# Patient Record
Sex: Female | Born: 1955 | Race: Black or African American | Hispanic: No | Marital: Single | Smoking: Former smoker
Health system: Southern US, Community
[De-identification: ages and names within clinical notes are randomized; demographics above are authoritative.]

## PROBLEM LIST (undated history)

## (undated) DIAGNOSIS — I10 Essential (primary) hypertension: Secondary | ICD-10-CM

## (undated) DIAGNOSIS — D649 Anemia, unspecified: Secondary | ICD-10-CM

## (undated) HISTORY — DX: Anemia, unspecified: D64.9

## (undated) HISTORY — DX: Essential (primary) hypertension: I10

## (undated) HISTORY — PX: TUBAL LIGATION: SHX77

## (undated) HISTORY — PX: KNEE SURGERY: SHX244

## (undated) HISTORY — PX: ABDOMINAL HYSTERECTOMY: SHX81

---

## 1998-08-11 ENCOUNTER — Other Ambulatory Visit: Admission: RE | Admit: 1998-08-11 | Discharge: 1998-08-11 | Payer: Self-pay | Admitting: Internal Medicine

## 2001-11-15 ENCOUNTER — Other Ambulatory Visit: Admission: RE | Admit: 2001-11-15 | Discharge: 2001-11-15 | Payer: Self-pay | Admitting: Obstetrics & Gynecology

## 2001-11-21 ENCOUNTER — Encounter: Payer: Self-pay | Admitting: Obstetrics & Gynecology

## 2001-11-21 ENCOUNTER — Ambulatory Visit (HOSPITAL_COMMUNITY): Admission: RE | Admit: 2001-11-21 | Discharge: 2001-11-21 | Payer: Self-pay | Admitting: Obstetrics & Gynecology

## 2002-05-11 ENCOUNTER — Encounter (INDEPENDENT_AMBULATORY_CARE_PROVIDER_SITE_OTHER): Payer: Self-pay | Admitting: *Deleted

## 2002-05-12 ENCOUNTER — Inpatient Hospital Stay (HOSPITAL_COMMUNITY): Admission: AD | Admit: 2002-05-12 | Discharge: 2002-05-13 | Payer: Self-pay | Admitting: Obstetrics & Gynecology

## 2003-10-29 ENCOUNTER — Ambulatory Visit (HOSPITAL_COMMUNITY): Admission: RE | Admit: 2003-10-29 | Discharge: 2003-10-29 | Payer: Self-pay | Admitting: Obstetrics & Gynecology

## 2003-11-01 ENCOUNTER — Encounter: Admission: RE | Admit: 2003-11-01 | Discharge: 2003-11-01 | Payer: Self-pay | Admitting: Obstetrics & Gynecology

## 2004-10-08 ENCOUNTER — Ambulatory Visit (HOSPITAL_COMMUNITY): Admission: RE | Admit: 2004-10-08 | Discharge: 2004-10-08 | Payer: Self-pay | Admitting: Gastroenterology

## 2004-10-08 ENCOUNTER — Encounter (INDEPENDENT_AMBULATORY_CARE_PROVIDER_SITE_OTHER): Payer: Self-pay | Admitting: *Deleted

## 2004-10-20 ENCOUNTER — Ambulatory Visit (HOSPITAL_COMMUNITY): Admission: RE | Admit: 2004-10-20 | Discharge: 2004-10-20 | Payer: Self-pay | Admitting: Gastroenterology

## 2006-02-18 ENCOUNTER — Ambulatory Visit (HOSPITAL_COMMUNITY): Admission: RE | Admit: 2006-02-18 | Discharge: 2006-02-18 | Payer: Self-pay | Admitting: Obstetrics & Gynecology

## 2007-04-06 ENCOUNTER — Ambulatory Visit (HOSPITAL_COMMUNITY): Admission: RE | Admit: 2007-04-06 | Discharge: 2007-04-06 | Payer: Self-pay | Admitting: Obstetrics & Gynecology

## 2010-01-15 ENCOUNTER — Ambulatory Visit (HOSPITAL_COMMUNITY): Admission: RE | Admit: 2010-01-15 | Discharge: 2010-01-15 | Payer: Self-pay | Admitting: Obstetrics & Gynecology

## 2010-01-20 ENCOUNTER — Ambulatory Visit: Payer: Self-pay | Admitting: Genetic Counselor

## 2010-02-16 ENCOUNTER — Emergency Department (HOSPITAL_COMMUNITY): Admission: EM | Admit: 2010-02-16 | Discharge: 2010-02-16 | Payer: Self-pay | Admitting: Emergency Medicine

## 2010-05-16 ENCOUNTER — Encounter: Payer: Self-pay | Admitting: Cardiology

## 2010-05-17 ENCOUNTER — Encounter: Payer: Self-pay | Admitting: Obstetrics & Gynecology

## 2010-05-18 ENCOUNTER — Encounter: Payer: Self-pay | Admitting: Obstetrics & Gynecology

## 2010-07-08 LAB — DIFFERENTIAL
Lymphocytes Relative: 53 % — ABNORMAL HIGH (ref 12–46)
Lymphs Abs: 3.4 10*3/uL (ref 0.7–4.0)
Monocytes Absolute: 0.4 10*3/uL (ref 0.1–1.0)
Monocytes Relative: 6 % (ref 3–12)
Neutrophils Relative %: 39 % — ABNORMAL LOW (ref 43–77)

## 2010-07-08 LAB — POCT CARDIAC MARKERS
CKMB, poc: 2.5 ng/mL (ref 1.0–8.0)
Myoglobin, poc: 69.5 ng/mL (ref 12–200)
Troponin i, poc: <0.05 ng/mL (ref 0.00–0.09)

## 2010-07-08 LAB — POCT I-STAT, CHEM 8
Calcium, Ion: 1.25 mmol/L (ref 1.12–1.32)
Chloride: 105 mEq/L (ref 96–112)
Creatinine, Ser: 0.7 mg/dL (ref 0.4–1.2)
Potassium: 3.8 mEq/L (ref 3.5–5.1)
Sodium: 139 mEq/L (ref 135–145)
TCO2: 26 mmol/L (ref 0–100)

## 2010-07-08 LAB — CBC
HCT: 35.3 % — ABNORMAL LOW (ref 36.0–46.0)
MCH: 21.3 pg — ABNORMAL LOW (ref 26.0–34.0)
Platelets: 300 10*3/uL (ref 150–400)

## 2010-09-11 NOTE — H&P (Signed)
   [name], [name]                           ACCOUNT NO.:  [id number]   MEDICAL RECORD NO.:  [id number]                   PATIENT TYPE:  INP   LOCATION:  9306                                 FACILITY:  [facility]   PHYSICIAN:  Roseanna Rainbow, M.D.         DATE OF BIRTH:  1955/12/30   DATE OF ADMISSION:  05/12/2002  DATE OF DISCHARGE:  05/13/2002                                HISTORY & PHYSICAL   REDICTATION:   CHIEF COMPLAINT:  The patient is a 55 year old African-American female with  symptomatic uterine fibroids who presents for a total vaginal hysterectomy.   HISTORY OF PRESENT ILLNESS:  As above.   PAST OB/GYN HISTORY:  She is status post one NSVD and a bilateral tubal  ligation.   PAST MEDICAL HISTORY:  She denies.   ALLERGIES:  No known drug allergies.   MEDICATIONS:  She denies.   SOCIAL HISTORY:  She denies any tobacco, ethanol, or substance abuse.   PHYSICAL EXAMINATION:  VITAL SIGNS:  Blood pressure 130/76.  GENERAL:  Well-developed, well-nourished.  LUNGS:  Clear to auscultation bilaterally.  HEART:  Regular rate and rhythm.  ABDOMEN:  Soft, nontender.  No organomegaly.  PELVIC:  The external female genitalia are normal appearing.  On speculum  examination the vagina is clean.  On bimanual examination the uterus is  retroverted, approximately 12 weeks size in aggregate.  EXTREMITIES:  Nontender.   ASSESSMENT:  Moderately enlarged leiomyomatous uterus, symptomatic.   PLAN:  Total vaginal hysterectomy.                                               Roseanna Rainbow, M.D.    Judee Clara  D:  06/07/2002  T:  06/07/2002  Job:  [id number]

## 2010-09-11 NOTE — Op Note (Signed)
[name], [name]                           ACCOUNT NO.:  [id number]   MEDICAL RECORD NO.:  [id number]                   PATIENT TYPE:  OBV   LOCATION:  9399                                 FACILITY:  [facility]   PHYSICIAN:  Roseanna Rainbow, M.D.         DATE OF BIRTH:  March 20, 1956   DATE OF PROCEDURE:  05/11/2002  DATE OF DISCHARGE:                                 OPERATIVE REPORT   PREOPERATIVE DIAGNOSES:  Uterine fibroids.   POSTOPERATIVE DIAGNOSES:  Uterine fibroids.   PROCEDURE:  1. Total vaginal hysterectomy.  2. Morcellation with myomectomies and coring.  3. Modified McCall culdoplasty was performed as well.   SURGEON:  Roseanna Rainbow, M.D., Bing Neighbors. Clearance Coots, M.D.   ANESTHESIA:  General endotracheal.   COMPLICATIONS:  None.   ESTIMATED BLOOD LOSS:  300 cubic centimeters.   URINE OUTPUT:  200 cubic centimeters clear urine at the end of the  procedure.   FLUIDS:  As per anesthesiology.   FINDINGS:  Examination under anesthesia:  Retroverted uterus, upward normal  limits in size.  Normal tubes and ovaries.   PROCEDURE:  The risks, benefits, indications, and alternatives of the  procedure were reviewed with the patient and informed consent was obtained.  The patient was taken to the operating room with an IV running.  The patient  was placed in the dorsal lithotomy position and prepped and draped in the  usual sterile fashion.  A weighted speculum was then placed into the vagina  and the cervix grasped with the Christella Hartigan' tenaculum.  The cervix was then  injected circumferentially with 1% lidocaine with 1:200,000 of epinephrine.  The cervix was then incised anteriorly with the scalpel and the bladder  dissected off the pubovesical cervical fascia anteriorly with Metzenbaum  scissors.  The anterior cul-de-sac was entered sharply.  The same procedure  was performed posteriorly and the posterior cul-de-sac entered sharply  without difficulty.  At this point  parametrial clamps were placed over the  uterosacral ligaments on either side.  These were then transected and suture  ligated with 0 Vicryl.  Hemostasis was assured.  The cardinal ligaments were  then clamped on both sides, transected, and suture ligated in a similar  fashion.  The uterine arteries and broad ligament were then serially clamped  with parametrial clamps, transected, and suture ligated on both sides.  At  this point the uterine fundus was then debulked using coring and also  excising the myomas.  The uterine fundus was then bivalved.  The cornu were  then clamped with parametrial clamps, transected.  The pedicles were secured  with both free ligatures and suture ligatures with excellent hemostasis  noted.  At this point a Rogelio Seen culdoplasty was performed.  Suture was passed  from the vagina into the middle aspect of the posterior cul-de-sac and the  suture was then used to incorporate the uterosacral pedicle and reef across  the peritoneum  of the posterior cul-de-sac and then passed through the  opposite uterosacral pedicle and then passed through the posterior cul-de-  sac into the vagina and the suture was then tied.  The vaginal cuff angles  were closed with figure-of-eight sutures of 0 Vicryl on both sides.  The  uterosacral ligaments were plicated in the midline.  The remainder of the  vaginal cuff was closed with figure-of-eight sutures of 0 Vicryl in an  interrupted fashion.  All instruments were then removed from the vagina and  the patient taken out of dorsal lithotomy position and awakened from general  anesthesia.  The patient was taken to the PACU in stable condition.  Sponge,  lap, needle, and instrument counts were correct x2.                                               Roseanna Rainbow, M.D.    [name]  D:  05/11/2002  T:  05/11/2002  Job:  [id number]

## 2011-05-27 ENCOUNTER — Ambulatory Visit: Payer: BC Managed Care – PPO

## 2011-05-27 ENCOUNTER — Ambulatory Visit (INDEPENDENT_AMBULATORY_CARE_PROVIDER_SITE_OTHER): Payer: BC Managed Care – PPO | Admitting: Family Medicine

## 2011-05-27 DIAGNOSIS — R0989 Other specified symptoms and signs involving the circulatory and respiratory systems: Secondary | ICD-10-CM

## 2011-05-27 DIAGNOSIS — K635 Polyp of colon: Secondary | ICD-10-CM | POA: Insufficient documentation

## 2011-05-27 DIAGNOSIS — M25473 Effusion, unspecified ankle: Secondary | ICD-10-CM

## 2011-05-27 DIAGNOSIS — R0600 Dyspnea, unspecified: Secondary | ICD-10-CM

## 2011-05-27 DIAGNOSIS — D649 Anemia, unspecified: Secondary | ICD-10-CM

## 2011-05-27 DIAGNOSIS — I10 Essential (primary) hypertension: Secondary | ICD-10-CM | POA: Insufficient documentation

## 2011-05-27 DIAGNOSIS — H6120 Impacted cerumen, unspecified ear: Secondary | ICD-10-CM

## 2011-05-27 LAB — POCT CBC
HCT, POC: 31.8 % — AB (ref 37.7–47.9)
Hemoglobin: 9.8 g/dL — AB (ref 12.2–16.2)
MCH, POC: 19.8 pg — AB (ref 27–31.2)
POC LYMPH PERCENT: 47.6 %L (ref 10–50)
Platelet Count, POC: 326 10*3/uL (ref 142–424)
RBC: 4.94 M/uL (ref 4.04–5.48)
RDW, POC: 16.5 %

## 2011-05-27 MED ORDER — ALBUTEROL SULFATE HFA 108 (90 BASE) MCG/ACT IN AERS: 2.0000 | INHALATION_SPRAY | Freq: Four times a day (QID) | RESPIRATORY_TRACT | Status: DC | PRN

## 2011-05-27 NOTE — Patient Instructions (Addendum)
Restart iron twice per day.  claritin over the counter once per day. If wheezing, can try albuterol inhaler. Restart blood pressure med, and check blood pressures outside of office. We will have the radiologist look at your ankle xray, and gout test has been ordered. Recheck in 1 week, sooner if any worsening.  If symptoms not improving may need to follow up with gastroenterologist, and cardiology evaluation.  If any worsening of symptoms, or chest pain, call 911/go to ER.

## 2011-05-27 NOTE — Assessment & Plan Note (Addendum)
Prior hgb 11.4 - now 9.8.  Likely cause of DOE.  Restart iron bid, and follow up in next 1 week, sooner if worsening dyspnea. May need to see GI (las colonoscopy 2011)- will discuss at follow up in 1 week.

## 2011-05-27 NOTE — Assessment & Plan Note (Signed)
Off meds currently.  Will restart today.  Follow up next 3-4 weeks with outside bps.

## 2011-05-27 NOTE — Progress Notes (Signed)
Subjective:    Patient ID: [name], female    DOB: 1955/08/20, 56 y.o.   MRN: [id number]  Ear Fullness  There is pain in the right ear. This is a chronic problem. The current episode started more than 1 month ago. Associated symptoms include coughing and hearing loss. Pertinent negatives include no ear discharge, headaches, rash, rhinorrhea, sore throat or vomiting. She has tried ear drops for the symptoms. The treatment provided mild relief. There is no history of a chronic ear infection, hearing loss or a tympanostomy tube.  Shortness of Breath The current episode started yesterday. The problem occurs intermittently. The problem has been waxing and waning. Associated symptoms include coryza, ear pain, orthopnea and wheezing. Pertinent negatives include no chest pain, fever, headaches, hemoptysis, leg swelling, PND, rash, rhinorrhea, sore throat, sputum production, swollen glands or vomiting. The symptoms are aggravated by any activity and weather changes. Risk factors include oral contraceptive. She has tried nothing for the symptoms. The treatment provided mild relief. Her past medical history is significant for asthma. There is no history of CAD, COPD, DVT, a heart failure, PE, pneumonia or a recent surgery.   Ear fullness  for 2 months.  Better x 1 day with use of otc swimmers ear.  Occasional pain, but more of a stopped up feeling. No discharge.  Qtips to outside only.  SOB 2 days ago.with cold weather.  No fever, with activity - cough with inhaling air.  Hx asthma in past, but no recent flairs.  No rescue inhaler. Dyspnea with exertion. 3 pillows to sleep - chronic.  Sob if less than 3. Dry cough wheeze yesterday. Improves with rest. Indigestion with belching for 2 weeks.  Works in school, Education officer, environmental.  Dust causes more sx's past few days.  No hx cards eval.  Hx HTN -  Off BP meds past 5 days, going to pick up at pharmacy today.  Fasting today.  Right ankle swelling past 6 months.  NKI.  Worse after standing for long time.  No calf pain.  Tx: ace bandage.  No prior xr, or evaluation.  Review of Systems  Constitutional: Positive for fatigue. Negative for fever and chills.  HENT: Positive for hearing loss, ear pain and sneezing. Negative for congestion, sore throat, rhinorrhea, postnasal drip and ear discharge.   Eyes: Positive for pain.       Burning at times, crusting in am at times.  Dust causes worse sx's  Respiratory: Positive for cough, shortness of breath and wheezing. Negative for hemoptysis, sputum production and chest tightness.   Cardiovascular: Positive for palpitations and orthopnea. Negative for chest pain, leg swelling and PND.  Gastrointestinal: Negative for vomiting.  Musculoskeletal: Positive for joint swelling and arthralgias.       Right ankle only not calf.  Skin: Positive for wound. Negative for color change and rash.  Neurological: Negative for dizziness, light-headedness and headaches.  Hematological: Does not bruise/bleed easily.       Objective:   Physical Exam  Constitutional: She is oriented to person, place, and time. She appears well-developed and well-nourished.  HENT:  Head: Normocephalic and atraumatic.  Right Ear: External ear normal. No drainage, swelling or tenderness. No foreign bodies. No mastoid tenderness.  Left Ear: External ear normal. No drainage, swelling or tenderness. No foreign bodies. No mastoid tenderness.  Nose: Nose normal.  Mouth/Throat: Oropharynx is clear and moist and mucous membranes are normal.       Cerumen bilterally, obstructive on rt - unable to  visualize tms  Eyes: Conjunctivae and EOM are normal. Pupils are equal, round, and reactive to light.  Neck: Normal range of motion. Neck supple.  Cardiovascular: Normal rate, regular rhythm, normal heart sounds and normal pulses.   No extrasystoles are present.  No murmur heard.      No bruits.  Pulmonary/Chest: Effort normal and breath sounds normal. No respiratory  distress. She has no wheezes. She has no rales. She exhibits no tenderness.  Abdominal: Soft.  Musculoskeletal: Normal range of motion.       Right ankle: She exhibits swelling. She exhibits normal range of motion, no ecchymosis, no deformity, no laceration and normal pulse. No lateral malleolus and no medial malleolus tenderness found.  Neurological: She is alert and oriented to person, place, and time.  Skin: Skin is warm and dry.  Psychiatric: She has a normal mood and affect.   Results for orders placed in visit on 05/27/11  POCT CBC      Component Value Range   WBC 6.7  4.6 - 10.2 (K/uL)   Lymph, poc 3.2  0.6 - 3.4    POC LYMPH PERCENT 47.6  10 - 50 (%L)   MID (cbc) 0.3  0 - 0.9    POC MID % 5.0  0 - 12 (%M)   POC Granulocyte 3.2  2 - 6.9    Granulocyte percent 47.4  37 - 80 (%G)   RBC 4.94  4.04 - 5.48 (M/uL)   Hemoglobin 9.8 (*) 12.2 - 16.2 (g/dL)   HCT, POC 40.9 (*) 81.1 - 47.9 (%)   MCV 64.3 (*) 80 - 97 (fL)   MCH, POC 19.8 (*) 27 - 31.2 (pg)   MCHC 30.8 (*) 31.8 - 35.4 (g/dL)   RDW, POC 91.4     Platelet Count, POC 326  142 - 424 (K/uL)   MPV 10.3  0 - 99.8 (fL)     EKG: sr, no acute findings - see scanned copy [facility] reading (PRIMARY) by  Dr. Neva Seat: Ankle: calcification medial malleolus CXR: NAD   Assessment & Plan:   1. Anemia  POCT CBC, IFOBT POC (occult bld, rslt in office)  2. HTN (hypertension)  Comprehensive metabolic panel, Lipid panel  3. Dyspnea  EKG 12-Lead, DG Chest 2 View, POCT CBC, Lipid panel  4. Cerumen impaction    5. Ankle swelling  Uric acid, DG Ankle Complete Right   -see problem list re: anemia, htn.  Recent dyspnea, cough with episodic wheeze, hx asthma.  - likely allergic.  otc claritin qd, proair q4-6h wheeze, recheck if not improved in 2-3d, o/w 1 week.  If any chest pain, or worse sx's - call 911/go to ER.  Improved hearing post r lavage.  Debrox otc in future, and rtc if not improved. Avoid qtips.  Ankle swelling - OA vs other  arthropathy  -xr to overread, check uric acid.

## 2011-05-28 LAB — COMPREHENSIVE METABOLIC PANEL
ALT: 9 U/L (ref 0–35)
AST: 10 U/L (ref 0–37)
BUN: 11 mg/dL (ref 6–23)
Chloride: 107 mEq/L (ref 96–112)
Glucose, Bld: 82 mg/dL (ref 70–99)
Total Bilirubin: 0.4 mg/dL (ref 0.3–1.2)
Total Protein: 6.6 g/dL (ref 6.0–8.3)

## 2011-05-28 LAB — LIPID PANEL
Cholesterol: 170 mg/dL (ref 0–200)
Total CHOL/HDL Ratio: 2.4 Ratio
VLDL: 12 mg/dL (ref 0–40)

## 2011-06-15 ENCOUNTER — Ambulatory Visit (INDEPENDENT_AMBULATORY_CARE_PROVIDER_SITE_OTHER): Payer: BC Managed Care – PPO | Admitting: Family Medicine

## 2011-06-15 VITALS — BP 150/102 | HR 80 | Temp 98.2°F | Resp 18 | Ht 68.5 in | Wt 240.4 lb

## 2011-06-15 DIAGNOSIS — D649 Anemia, unspecified: Secondary | ICD-10-CM

## 2011-06-15 DIAGNOSIS — I10 Essential (primary) hypertension: Secondary | ICD-10-CM

## 2011-06-15 DIAGNOSIS — R509 Fever, unspecified: Secondary | ICD-10-CM

## 2011-06-15 DIAGNOSIS — J019 Acute sinusitis, unspecified: Secondary | ICD-10-CM

## 2011-06-15 LAB — POCT CBC
MCV: 63.1 fL — AB (ref 80–97)
MID (cbc): 0.4 (ref 0–0.9)
POC LYMPH PERCENT: 43 %L (ref 10–50)
Platelet Count, POC: 323 10*3/uL (ref 142–424)
RBC: 5.42 M/uL (ref 4.04–5.48)
WBC: 7 10*3/uL (ref 4.6–10.2)

## 2011-06-15 MED ORDER — CEFDINIR 300 MG PO CAPS: 300.0000 mg | ORAL_CAPSULE | Freq: Two times a day (BID) | ORAL | Status: AC

## 2011-06-15 MED ORDER — HYDROCHLOROTHIAZIDE 25 MG PO TABS: 25.0000 mg | ORAL_TABLET | Freq: Every day | ORAL | Status: DC

## 2011-06-15 NOTE — Progress Notes (Signed)
Subjective:    Patient ID: [name], female    DOB: Oct 27, 1955, 56 y.o.   MRN: [id number]  HPI [name] is a 56 y.o. female See last ov - multiple issues addressed including HTN, Dyspnea, and anemia. Instructed to restart iron, rx proair, and restarted HCTZ.  Has not checked outside blood pressures. No missed doses.  No chest pain.   Breathing ok, did not need to fill inhaler, but face and nose more congested since last office visit. Thick yellow nasal discharge with blood past 3 nights.  Feels sinuses are worse.  Burning in face.  subjective hot in face - has not checked temp.  Stopped claritin after 5 days as it did not help.  Tx: ibuprofen, no nasal sprays.   Review of Systems  Constitutional: Positive for fever and chills.       Subjective  HENT: Positive for congestion and sinus pressure. Negative for nosebleeds.        Blood in mucus only, no true nosebleed.  Respiratory: Positive for cough. Negative for shortness of breath.   Cardiovascular: Negative for chest pain.  Gastrointestinal: Negative for abdominal pain and blood in stool.  Neurological: Positive for light-headedness and headaches.       Objective:   Physical Exam  Constitutional: She is oriented to person, place, and time. She appears well-developed and well-nourished. No distress.  HENT:  Head: Normocephalic and atraumatic.  Right Ear: Hearing, tympanic membrane, external ear and ear canal normal.  Left Ear: Hearing, tympanic membrane, external ear and ear canal normal.  Nose: Mucosal edema present. No nose lacerations or nasal septal hematoma. No epistaxis. Right sinus exhibits no maxillary sinus tenderness and no frontal sinus tenderness. Left sinus exhibits no maxillary sinus tenderness and no frontal sinus tenderness.  Mouth/Throat: Oropharynx is clear and moist. No oropharyngeal exudate.  Eyes: Conjunctivae and EOM are normal. Pupils are equal, round, and reactive to light.  Cardiovascular: Normal  rate, regular rhythm, normal heart sounds and intact distal pulses.   No murmur heard. Pulmonary/Chest: Effort normal and breath sounds normal. No respiratory distress. She has no wheezes. She has no rhonchi.  Abdominal: Soft. There is no tenderness.  Neurological: She is alert and oriented to person, place, and time.  Skin: Skin is warm and dry. No rash noted.  Psychiatric: She has a normal mood and affect. Her behavior is normal.    BMP reviewed from 05/27/11.  Creatinine in normal range.  Results for orders placed in visit on 06/15/11  POCT CBC      Component Value Range   WBC 7.0  4.6 - 10.2 (K/uL)   Lymph, poc 3.0  0.6 - 3.4    POC LYMPH PERCENT 43.0  10 - 50 (%L)   MID (cbc) 0.4  0 - 0.9    POC MID % 5.2  0 - 12 (%M)   POC Granulocyte 3.6  2 - 6.9    Granulocyte percent 51.8  37 - 80 (%G)   RBC 5.42  4.04 - 5.48 (M/uL)   Hemoglobin 10.8 (*) 12.2 - 16.2 (g/dL)   HCT, POC 52.8 (*) 41.3 - 47.9 (%)   MCV 63.1 (*) 80 - 97 (fL)   MCH, POC 19.9 (*) 27 - 31.2 (pg)   MCHC 31.6 (*) 31.8 - 35.4 (g/dL)   RDW, POC 24.4     Platelet Count, POC 323  142 - 424 (K/uL)   MPV    0 - 99.8 (fL)  Assessment & Plan:   1. Anemia    2. HTN (hypertension)    3. Sinusitis acute  POCT CBC  4. Fever  POCT CBC   Hemoglobin is improving, can increase iron to BID.  Start saline ns and omnicef for sinusitis.  Ok to restart claritin as allergic contributor possible.increase hctz to 25mg  qd, and check home BP's.  Orthostatic precautions reviewed.  If any increased lightheadedness or dizziness - return to clinic. Recheck OV in 2-3 weeks,  Return to the clinic or go to the nearest emergency room if  symptoms worsen or new symptoms occur.

## 2011-06-15 NOTE — Patient Instructions (Signed)

## 2011-10-25 ENCOUNTER — Other Ambulatory Visit: Payer: Self-pay | Admitting: Family Medicine

## 2012-01-21 ENCOUNTER — Other Ambulatory Visit: Payer: Self-pay | Admitting: Physician Assistant

## 2012-02-24 ENCOUNTER — Other Ambulatory Visit: Payer: Self-pay | Admitting: Physician Assistant

## 2012-05-11 ENCOUNTER — Ambulatory Visit (INDEPENDENT_AMBULATORY_CARE_PROVIDER_SITE_OTHER): Payer: BC Managed Care – PPO | Admitting: Family Medicine

## 2012-05-11 VITALS — BP 146/95 | HR 71 | Temp 98.1°F | Resp 18 | Ht 68.5 in | Wt 223.8 lb

## 2012-05-11 DIAGNOSIS — I10 Essential (primary) hypertension: Secondary | ICD-10-CM

## 2012-05-11 DIAGNOSIS — J329 Chronic sinusitis, unspecified: Secondary | ICD-10-CM

## 2012-05-11 DIAGNOSIS — H6120 Impacted cerumen, unspecified ear: Secondary | ICD-10-CM

## 2012-05-11 DIAGNOSIS — D649 Anemia, unspecified: Secondary | ICD-10-CM

## 2012-05-11 LAB — POCT CBC
HCT, POC: 35.8 % — AB (ref 37.7–47.9)
Hemoglobin: 11.2 g/dL — AB (ref 12.2–16.2)
Lymph, poc: 4.4 — AB (ref 0.6–3.4)
MCV: 65.2 fL — AB (ref 80–97)
MID (cbc): 0.4 (ref 0–0.9)
POC Granulocyte: 2.4 (ref 2–6.9)
POC LYMPH PERCENT: 60.7 %L — AB (ref 10–50)
Platelet Count, POC: 3183 10*3/uL — AB (ref 142–424)
RDW, POC: 17.4 %

## 2012-05-11 MED ORDER — HYDROCHLOROTHIAZIDE 25 MG PO TABS: 25.0000 mg | ORAL_TABLET | Freq: Every day | ORAL | Status: DC

## 2012-05-11 NOTE — Progress Notes (Signed)
Subjective:    Patient ID: [name], female    DOB: 05-31-1955, 57 y.o.   MRN: [id number] Chief Complaint  Patient presents with  . Medication Refill    patient needs a refill on hztz    HPI  HA sore throat, left sinus pressure, no appetite.  Has hot flashes, had to go home from work.  Sluggish feeling.  Starts whenever the weather chagnes, eyes burns, arms and fingers hurting at night but chronic. Ate some juice and a organic candy bar today so not completely fasting. Gets swelling in ankle. Has been doing an herbal clense and garcinia cambogia which is supposed to be an appetite suppressant. Has been out of her hctz for several days No flu shot this year  Review of Systems  Constitutional: Positive for diaphoresis, activity change, appetite change and fatigue. Negative for fever, chills and unexpected weight change.  HENT: Positive for ear pain, congestion, sore throat, rhinorrhea, sneezing and sinus pressure. Negative for nosebleeds, trouble swallowing, neck pain, neck stiffness, voice change, postnasal drip and ear discharge.   Eyes: Positive for pain. Negative for discharge, redness, itching and visual disturbance.  Respiratory: Negative for cough and shortness of breath.   Cardiovascular: Negative for chest pain.  Gastrointestinal: Negative for nausea, vomiting and abdominal pain.  Musculoskeletal: Positive for myalgias, joint swelling and arthralgias. Negative for gait problem.  Skin: Negative for rash.  Neurological: Positive for headaches. Negative for dizziness and syncope.  Hematological: Negative for adenopathy.  Psychiatric/Behavioral: Positive for sleep disturbance.      BP 146/95  Pulse 71  Temp 98.1 F (36.7 C) (Oral)  Resp 18  Ht 5' 8.5" (1.74 m)  Wt 223 lb 12.8 oz (101.515 kg)  BMI 33.53 kg/m2  SpO2 98% Objective:   Physical Exam  Constitutional: She is oriented to person, place, and time. She appears well-developed and well-nourished. No distress.    HENT:  Head: Normocephalic and atraumatic.  Right Ear: Tympanic membrane, external ear and ear canal normal.  Left Ear: Tympanic membrane, external ear and ear canal normal.  Nose: Rhinorrhea present. No mucosal edema.  Mouth/Throat: Uvula is midline, oropharynx is clear and moist and mucous membranes are normal. No oropharyngeal exudate.  Eyes: Conjunctivae normal are normal. Right eye exhibits no discharge. Left eye exhibits no discharge. No scleral icterus.  Neck: Normal range of motion. Neck supple.  Cardiovascular: Normal rate, regular rhythm, normal heart sounds and intact distal pulses.   Pulmonary/Chest: Effort normal and breath sounds normal.  Lymphadenopathy:    She has no cervical adenopathy.  Neurological: She is alert and oriented to person, place, and time.  Skin: Skin is warm and dry. She is not diaphoretic. No erythema.  Psychiatric: She has a normal mood and affect. Her behavior is normal.          Results for orders placed in visit on 05/11/12  POCT CBC      Component Value Range   WBC 7.2  4.6 - 10.2 K/uL   Lymph, poc 4.4 (*) 0.6 - 3.4   POC LYMPH PERCENT 60.7 (*) 10 - 50 %L   MID (cbc) 0.4  0 - 0.9   POC MID % 5.4  0 - 12 %M   POC Granulocyte 2.4  2 - 6.9   Granulocyte percent 33.9 (*) 37 - 80 %G   RBC 5.49 (*) 4.04 - 5.48 M/uL   Hemoglobin 11.2 (*) 12.2 - 16.2 g/dL   HCT, POC 40.9 (*) 81.1 - 47.9 %  MCV 65.2 (*) 80 - 97 fL   MCH, POC 20.4 (*) 27 - 31.2 pg   MCHC 31.3 (*) 31.8 - 35.4 g/dL   RDW, POC 40.9     Platelet Count, POC 3183 (*) 142 - 424 K/uL   MPV    0 - 99.8 fL    Assessment & Plan:   1. Sinusitis  Do not think pt has a bacterial infection - seems to be coming more from congestion - rec netti pot or sinus rinse, vics vaporub, hot steam, claritin, mucinex  2. HTN (hypertension)  hydrochlorothiazide (HYDRODIURIL) 25 MG tablet refilled  3. Cerumen impaction  Removed some by curette myself, completed w/ lavage in left ear  4. Anemia  POCT  CBC, Ferritin, IFOBT POC (occult bld, rslt in office). Rec hgb electrophoresis at f/u as iron level nml  Mult meds - needs bmp at f/u when on hctz  HM - needs flp at f/u

## 2012-08-19 ENCOUNTER — Ambulatory Visit (INDEPENDENT_AMBULATORY_CARE_PROVIDER_SITE_OTHER): Payer: BC Managed Care – PPO | Admitting: Emergency Medicine

## 2012-08-19 ENCOUNTER — Ambulatory Visit: Payer: BC Managed Care – PPO

## 2012-08-19 VITALS — BP 122/78 | HR 77 | Temp 98.2°F | Resp 17 | Ht 68.0 in | Wt 225.0 lb

## 2012-08-19 DIAGNOSIS — M25512 Pain in left shoulder: Secondary | ICD-10-CM

## 2012-08-19 DIAGNOSIS — M25519 Pain in unspecified shoulder: Secondary | ICD-10-CM

## 2012-08-19 MED ORDER — MELOXICAM 7.5 MG PO TABS: ORAL_TABLET | ORAL | Status: DC

## 2012-08-19 NOTE — Progress Notes (Signed)
  Subjective:    Patient ID: [name], female    DOB: 1955/10/28, 57 y.o.   MRN: [id number]  HPI patient was in her usual state of health until one month ago when at work she struck her left shoulder. She now presents with pain in her left arm. She feels she has developed a knot-like areas in the humerus. She has pain when she elevates her left arm    Review of Systems     Objective:   Physical Exam there is tenderness over the left subdeltoid area. There is pain with abduction of the left arm. There is no definite weakness of the left arm. She is unable to lift her left arm above shoulder height.  [facility] reading (PRIMARY) by  Dr.Royalti Schauf normal films of the shoulder and humerus. There is mild narrowing of the a.c. joint        Assessment & Plan:  Physical exam consistent with bursitis of the right shoulder. Will treat with meloxicam 7.5 one to 2 daily and a sheet regarding shoulder bursitis given to patient. Patient feels a full sensation in her left biceps that I am not able to appreciate. I told her if she continued to feel discomfort in the biceps area to call and we would schedule an MRI of the left arm to include the biceps muscle.

## 2012-08-19 NOTE — Patient Instructions (Signed)
Bursitis Bursitis is a swelling and soreness (inflammation) of a fluid-filled sac (bursa) that overlies and protects a joint. It can be caused by injury, overuse of the joint, arthritis or infection. The joints most likely to be affected are the elbows, shoulders, hips and knees. HOME CARE INSTRUCTIONS   Apply ice to the affected area for 15 to 20 minutes each hour while awake for 2 days. Put the ice in a plastic bag and place a towel between the bag of ice and your skin.  Rest the injured joint as much as possible, but continue to put the joint through a full range of motion, 4 times per day. (The shoulder joint especially becomes rapidly "frozen" if not used.) When the pain lessens, begin normal slow movements and usual activities.  Only take over-the-counter or prescription medicines for pain, discomfort or fever as directed by your caregiver.  Your caregiver may recommend draining the bursa and injecting medicine into the bursa. This may help the healing process.  Follow all instructions for follow-up with your caregiver. This includes any orthopedic referrals, physical therapy and rehabilitation. Any delay in obtaining necessary care could result in a delay or failure of the bursitis to heal and chronic pain. SEEK IMMEDIATE MEDICAL CARE IF:   Your pain increases even during treatment.  You develop an oral temperature above 102 F (38.9 C) and have heat and inflammation over the involved bursa. MAKE SURE YOU:   Understand these instructions.  Will watch your condition.  Will get help right away if you are not doing well or get worse. Document Released: 04/09/2000 Document Revised: 07/05/2011 Document Reviewed: 03/14/2009 [employer] Patient Information 2013 [employer].  

## 2012-08-25 ENCOUNTER — Ambulatory Visit (INDEPENDENT_AMBULATORY_CARE_PROVIDER_SITE_OTHER): Payer: BC Managed Care – PPO | Admitting: Family Medicine

## 2012-08-25 VITALS — BP 101/70 | HR 79 | Temp 98.9°F | Resp 17 | Wt 217.0 lb

## 2012-08-25 DIAGNOSIS — R52 Pain, unspecified: Secondary | ICD-10-CM

## 2012-08-25 DIAGNOSIS — J111 Influenza due to unidentified influenza virus with other respiratory manifestations: Secondary | ICD-10-CM

## 2012-08-25 DIAGNOSIS — R51 Headache: Secondary | ICD-10-CM

## 2012-08-25 DIAGNOSIS — J101 Influenza due to other identified influenza virus with other respiratory manifestations: Secondary | ICD-10-CM

## 2012-08-25 DIAGNOSIS — R718 Other abnormality of red blood cells: Secondary | ICD-10-CM

## 2012-08-25 LAB — POCT CBC
Granulocyte percent: 25.9 %G — AB (ref 37–80)
HCT, POC: 40.4 % (ref 37.7–47.9)
Hemoglobin: 12.4 g/dL (ref 12.2–16.2)
MCH, POC: 20.2 pg — AB (ref 27–31.2)
MCV: 65.7 fL — AB (ref 80–97)
RBC: 6.15 M/uL — AB (ref 4.04–5.48)
WBC: 5.7 10*3/uL (ref 4.6–10.2)

## 2012-08-25 LAB — POCT INFLUENZA A/B: Influenza B, POC: POSITIVE

## 2012-08-25 MED ORDER — OSELTAMIVIR PHOSPHATE 75 MG PO CAPS: 75.0000 mg | ORAL_CAPSULE | Freq: Two times a day (BID) | ORAL | Status: DC

## 2012-08-25 MED ORDER — HYDROCODONE-HOMATROPINE 5-1.5 MG/5ML PO SYRP: 5.0000 mL | ORAL_SOLUTION | Freq: Three times a day (TID) | ORAL | Status: DC | PRN

## 2012-08-25 NOTE — Progress Notes (Signed)
[facility] [address] [place] [address] [phone]  Date:  08/25/2012   Name:  [name]   DOB:  1955/09/22   MRN:  [id number]  PCP:  No primary provider on file.    Chief Complaint: Headache, Generalized Body Aches and Chills   History of Present Illness:  [name] is a 57 y.o. very pleasant female patient who presents with the following:  She is here today with illness.  She came home from work on Tuesday (today is Friday).  She felt tired, achy and had a HA.  She went to her second job and had to leave early as she felt ill.  The next day she continued to have chills, aches all over, felt tired and had to stay in bed.   Yesterday she felt awful, had a bad headache.  She felt dizzy if she stood for too long.  She also developed a cough.  She has nausea, diarrhea but no vomiting.    She did have a ST yesterday, this is now better.  No ear ache.   She has not yet taken any antipyretics today  Patient Active Problem List   Diagnosis Date Noted  . Anemia 05/27/2011  . HTN (hypertension) 05/27/2011  . Colon polyps 05/27/2011    Past Medical History  Diagnosis Date  . Hypertension     Past Surgical History  Procedure Laterality Date  . Abdominal hysterectomy    . Tubal ligation      History  Substance Use Topics  . Smoking status: Former Smoker    Types: Cigarettes    Quit date: 05/26/2000  . Smokeless tobacco: Never Used  . Alcohol Use: 0.6 oz/week    1 Glasses of wine per week    Family History  Problem Relation Age of Onset  . Heart disease Mother   . Cancer Father     Allergies  Allergen Reactions  . Latex   . Adhesive (Tape) Rash    With latex bandages.    Medication list has been reviewed and updated.  Current Outpatient Prescriptions on File Prior to Visit  Medication Sig Dispense Refill  . aspirin 81 MG tablet Take 81 mg by mouth daily.      Marland Kitchen estrogens conjugated, synthetic B, (ENJUVIA) 0.3 MG tablet Take  0.9 mg by mouth daily.      Marland Kitchen GARCINIA CAMBOGIA-CHROMIUM PO Take by mouth 3 (three) times daily.      . hydrochlorothiazide (HYDRODIURIL) 25 MG tablet Take 1 tablet (25 mg total) by mouth daily.  30 tablet  5  . loratadine (CLARITIN) 10 MG tablet Take 10 mg by mouth daily.       No current facility-administered medications on file prior to visit.    Review of Systems:  As per HPI- otherwise negative.   Physical Examination: Filed Vitals:   08/25/12 1203  BP: 101/70  Pulse: 79  Temp: 98.9 F (37.2 C)  Resp: 17   Filed Vitals:   08/25/12 1203  Weight: 217 lb (98.431 kg)   Body mass index is 33 kg/(m^2). Ideal Body Weight:    GEN: WDWN, NAD, Non-toxic, A & O x 3, overweight HEENT: Atraumatic, Normocephalic. Neck supple. No masses, No LAD.  Bilateral TM wnl, oropharynx normal.  PEERL,EOMI.   Nasal cavity is congested and inflamed Ears and Nose: No external deformity. CV: RRR, No M/G/R. No JVD. No thrill. No extra heart sounds. PULM: CTA B, no wheezes, crackles,  rhonchi. No retractions. No resp. distress. No accessory muscle use. ABD: S, NT, ND. No rebound. No HSM. EXTR: No c/c/e NEURO Normal gait.  PSYCH: Normally interactive. Conversant. Not depressed or anxious appearing.  Calm demeanor.   Results for orders placed in visit on 08/25/12  POCT CBC      Result Value Range   WBC 5.7  4.6 - 10.2 K/uL   Lymph, poc 3.7 (*) 0.6 - 3.4   POC LYMPH PERCENT 64.9 (*) 10 - 50 %L   MID (cbc) 0.5  0 - 0.9   POC MID % 9.2  0 - 12 %M   POC Granulocyte 1.5 (*) 2 - 6.9   Granulocyte percent 25.9 (*) 37 - 80 %G   RBC 6.15 (*) 4.04 - 5.48 M/uL   Hemoglobin 12.4  12.2 - 16.2 g/dL   HCT, POC 16.1  09.6 - 47.9 %   MCV 65.7 (*) 80 - 97 fL   MCH, POC 20.2 (*) 27 - 31.2 pg   MCHC 30.7 (*) 31.8 - 35.4 g/dL   RDW, POC 04.5     Platelet Count, POC 302  142 - 424 K/uL   MPV    0 - 99.8 fL  POCT INFLUENZA A/B      Result Value Range   Influenza A, POC Negative     Influenza B, POC Positive       Assessment and Plan: Influenza B - Plan: oseltamivir (TAMIFLU) 75 MG capsule, HYDROcodone-homatropine (HYCODAN) 5-1.5 MG/5ML syrup  Body aches - Plan: POCT CBC, POCT Influenza A/B  Headache  Microcytosis - Plan: Hemoglobinopathy evaluation  Diagnosis of flu B today.  Treat with tamiflu and hycodan.  Follow-up if not better.  Sooner if worse.   Note for work- to return next week.   Noted microcytosis she had a normal ferritin in January.  Will do hg electrophoresis- ? thalassemia   Signed Abbe Amsterdam, MD

## 2012-08-25 NOTE — Patient Instructions (Addendum)
You have the flu. Rest and drink plenty of fluids.  Use the tamiflu as directed.  Use the cough syrup as needed for cough and aches.  Be careful of sedation with the cough syrup.  If you are not better in the next few days please let me know.  Sooner if you get worse!

## 2012-08-27 ENCOUNTER — Telehealth: Payer: Self-pay

## 2012-08-27 NOTE — Telephone Encounter (Signed)
Patient has concerns about her fluctuating BP. It was 122/78 and 101/70. Would like a call back. CB [id number].

## 2012-08-28 NOTE — Telephone Encounter (Signed)
This may be form her recent illness. She has had the flu. If it continues or if she had dizziness, she should return to clinic. She is encouraged to push fluids, to return to clinic if not better soon. To you FYI

## 2012-08-29 LAB — HEMOGLOBINOPATHY EVALUATION
Hemoglobin Other: 0 %
Hgb A2 Quant: 2.3 % (ref 2.2–3.2)
Hgb A: 97.7 % (ref 96.8–97.8)
Hgb F Quant: 0 % (ref 0.0–2.0)
Hgb S Quant: 0 %

## 2012-09-01 ENCOUNTER — Encounter: Payer: Self-pay | Admitting: Family Medicine

## 2012-09-01 NOTE — Addendum Note (Signed)
Addended by: Abbe Amsterdam C on: 09/01/2012 09:43 AM   Modules accepted: Orders

## 2012-10-30 ENCOUNTER — Other Ambulatory Visit: Payer: Self-pay | Admitting: Family Medicine

## 2012-11-23 ENCOUNTER — Other Ambulatory Visit (HOSPITAL_COMMUNITY): Payer: Self-pay | Admitting: Obstetrics & Gynecology

## 2012-11-27 NOTE — Telephone Encounter (Signed)
Rx refill denied office visit needed. Patient last office visit October 2012.

## 2013-01-17 ENCOUNTER — Encounter: Payer: Self-pay | Admitting: Obstetrics & Gynecology

## 2013-01-17 ENCOUNTER — Ambulatory Visit (INDEPENDENT_AMBULATORY_CARE_PROVIDER_SITE_OTHER): Payer: BC Managed Care – PPO | Admitting: Obstetrics & Gynecology

## 2013-01-17 VITALS — BP 136/85 | HR 71 | Temp 98.8°F | Ht 68.0 in | Wt 226.0 lb

## 2013-01-17 DIAGNOSIS — Z01419 Encounter for gynecological examination (general) (routine) without abnormal findings: Secondary | ICD-10-CM

## 2013-01-17 DIAGNOSIS — Z780 Asymptomatic menopausal state: Secondary | ICD-10-CM

## 2013-01-17 NOTE — Progress Notes (Signed)
Subjective:     [name] is a 57 y.o. female here for a routine exam.  Current complaints: Patient is in the office today for her annual exam and her hormone therapy follow up. Patient states her estrogen is no longer working- patient is having hot flashes. Personal health questionnaire reviewed: no.   Gynecologic History No LMP recorded. Patient has had a hysterectomy. Contraception: status post hysterectomy Last Pap: hysterectomy.  Last mammogram: 3 years ago- [facility]. Results were: normal  Obstetric History OB History  No data available     The following portions of the patient's history were reviewed and updated as appropriate: allergies, current medications, past family history, past medical history, past social history, past surgical history and problem list.  Review of Systems Pertinent items are noted in HPI.    Objective:    General appearance: alert Breasts: normal appearance, no masses or tenderness Abdomen: soft, non-tender; bowel sounds normal; no masses,  no organomegaly Pelvic:  external genitalia normal, no adnexal masses or tenderness, and vagina normal without discharge--palpable nodule upper 1/3, < 1 cm, sidewall      Assessment:    Healthy female exam.  ?Hot flushes refractory to ERT   Plan:   Orders Placed This Encounter  Procedures  . TSH  . Estradiol  Return in a few months

## 2013-01-17 NOTE — Patient Instructions (Signed)

## 2013-01-18 ENCOUNTER — Other Ambulatory Visit: Payer: Self-pay | Admitting: Obstetrics & Gynecology

## 2013-01-18 DIAGNOSIS — Z1231 Encounter for screening mammogram for malignant neoplasm of breast: Secondary | ICD-10-CM

## 2013-02-02 ENCOUNTER — Other Ambulatory Visit: Payer: Self-pay | Admitting: Physician Assistant

## 2013-02-05 ENCOUNTER — Other Ambulatory Visit: Payer: Self-pay | Admitting: Obstetrics & Gynecology

## 2013-02-23 ENCOUNTER — Ambulatory Visit
Admission: RE | Admit: 2013-02-23 | Discharge: 2013-02-23 | Disposition: A | Discharge status: Home / Self Care | Payer: BC Managed Care – PPO | Source: Ambulatory Visit | Attending: Obstetrics & Gynecology | Admitting: Obstetrics & Gynecology

## 2013-02-23 DIAGNOSIS — Z1231 Encounter for screening mammogram for malignant neoplasm of breast: Secondary | ICD-10-CM

## 2013-03-31 ENCOUNTER — Ambulatory Visit (INDEPENDENT_AMBULATORY_CARE_PROVIDER_SITE_OTHER): Payer: BC Managed Care – PPO | Admitting: Family Medicine

## 2013-03-31 VITALS — BP 170/90 | HR 73 | Temp 98.4°F | Resp 18 | Ht 68.75 in | Wt 224.0 lb

## 2013-03-31 DIAGNOSIS — Z23 Encounter for immunization: Secondary | ICD-10-CM

## 2013-03-31 DIAGNOSIS — E663 Overweight: Secondary | ICD-10-CM

## 2013-03-31 DIAGNOSIS — I10 Essential (primary) hypertension: Secondary | ICD-10-CM

## 2013-03-31 LAB — COMPREHENSIVE METABOLIC PANEL
ALT: 11 U/L (ref 0–35)
AST: 12 U/L (ref 0–37)
Albumin: 4.2 g/dL (ref 3.5–5.2)
CO2: 26 mEq/L (ref 19–32)
Calcium: 9.4 mg/dL (ref 8.4–10.5)
Chloride: 105 mEq/L (ref 96–112)
Creat: 0.6 mg/dL (ref 0.50–1.10)
Potassium: 4.3 mEq/L (ref 3.5–5.3)
Total Protein: 7.1 g/dL (ref 6.0–8.3)

## 2013-03-31 LAB — POCT CBC
Hemoglobin: 10.4 g/dL — AB (ref 12.2–16.2)
Lymph, poc: 3.2 (ref 0.6–3.4)
MCHC: 30.1 g/dL — AB (ref 31.8–35.4)
MID (cbc): 0.3 (ref 0–0.9)
POC Granulocyte: 2.5 (ref 2–6.9)
POC LYMPH PERCENT: 53.3 %L — AB (ref 10–50)
POC MID %: 5.5 %M (ref 0–12)
Platelet Count, POC: 261 10*3/uL (ref 142–424)
RDW, POC: 17.4 %

## 2013-03-31 LAB — LIPID PANEL
Cholesterol: 160 mg/dL (ref 0–200)
Total CHOL/HDL Ratio: 2.4 Ratio
Triglycerides: 41 mg/dL (ref <150)

## 2013-03-31 MED ORDER — HYDROCHLOROTHIAZIDE 25 MG PO TABS: 25.0000 mg | ORAL_TABLET | Freq: Every day | ORAL | Status: DC

## 2013-03-31 NOTE — Patient Instructions (Signed)
Start back on your HCTZ medication for your high blood pressure.  I will be in touch with you regarding your other labs.    Please check your BP at the drug store or elsewhere over the next few weeks and let me know what you are running. You can   GenTeal eye lubricant may be helpful for you.

## 2013-03-31 NOTE — Progress Notes (Signed)
[facility] [address], [place] [address] [phone]  Date:  03/31/2013   Name:  [name]   DOB:  [birth year]   MRN:  [id number]  PCP:  No primary provider on file.    Chief Complaint: Medication Refill   History of Present Illness:  [name] is a 57 y.o. very pleasant female patient who presents with the following:  She is here today needing medication refills.  She is out of her HCTZ.   She has been out of this for about 2 weeks.   At her OB office in September she was 136/85  She has not yet had her flu shot this year and would like to do so today  Patient Active Problem List   Diagnosis Date Noted  . Anemia 05/27/2011  . HTN (hypertension) 05/27/2011  . Colon polyps 05/27/2011    Past Medical History  Diagnosis Date  . Hypertension     Past Surgical History  Procedure Laterality Date  . Abdominal hysterectomy    . Tubal ligation      History  Substance Use Topics  . Smoking status: Former Smoker    Types: Cigarettes    Quit date: 05/26/2000  . Smokeless tobacco: Never Used  . Alcohol Use: 0.6 oz/week    1 Glasses of wine per week    Family History  Problem Relation Age of Onset  . Heart disease Mother   . Cancer Father     Allergies  Allergen Reactions  . Latex   . Adhesive [Tape] Rash    With latex bandages.    Medication list has been reviewed and updated.  Current Outpatient Prescriptions on File Prior to Visit  Medication Sig Dispense Refill  . aspirin 81 MG tablet Take 81 mg by mouth daily.      Marland Kitchen ENJUVIA 0.9 MG tablet USE AS DIRECTED  30 tablet  0  . hydrochlorothiazide (HYDRODIURIL) 25 MG tablet Take 1 tablet (25 mg total) by mouth daily. PATIENT NEEDS OFFICE VISIT FOR ADDITIONAL REFILLS  30 tablet  0  . meloxicam (MOBIC) 15 MG tablet Take 15 mg by mouth daily.      Marland Kitchen loratadine (CLARITIN) 10 MG tablet Take 10 mg by mouth daily.       No current facility-administered medications on file prior to  visit.    Review of Systems:  As per HPI- otherwise negative.   Physical Examination: Filed Vitals:   03/31/13 0924  BP: 170/90  Pulse: 73  Temp: 98.4 F (36.9 C)  Resp: 18   Filed Vitals:   03/31/13 0924  Height: 5' 8.75" (1.746 m)  Weight: 224 lb (101.606 kg)   Body mass index is 33.33 kg/(m^2). Ideal Body Weight: Weight in (lb) to have BMI = 25: 167.7  GEN: WDWN, NAD, Non-toxic, A & O x 3, overweight, looks well HEENT: Atraumatic, Normocephalic. Neck supple. No masses, No LAD. Ears and Nose: No external deformity. CV: RRR, No M/G/R. No JVD. No thrill. No extra heart sounds. PULM: CTA B, no wheezes, crackles, rhonchi. No retractions. No resp. distress. No accessory muscle use.Marland Kitchen EXTR: No c/c/e NEURO Normal gait.  PSYCH: Normally interactive. Conversant. Not depressed or anxious appearing.  Calm demeanor.    Assessment and Plan: HTN (hypertension) - Plan: hydrochlorothiazide (HYDRODIURIL) 25 MG tablet, POCT CBC, Comprehensive metabolic panel  Overweight - Plan: Lipid panel  Restart HCTZ.  Await other labs.  Flu shot today.   See patient instructions  for more details.     Signed Abbe Amsterdam, MD

## 2013-04-04 ENCOUNTER — Ambulatory Visit: Payer: Self-pay | Admitting: Obstetrics & Gynecology

## 2013-05-01 ENCOUNTER — Other Ambulatory Visit: Payer: Self-pay | Admitting: *Deleted

## 2013-05-01 MED ORDER — ESTROGENS CONJ SYNTHETIC B 0.9 MG PO TABS: ORAL_TABLET | ORAL | Status: DC

## 2013-05-02 ENCOUNTER — Other Ambulatory Visit: Payer: Self-pay | Admitting: *Deleted

## 2013-05-29 ENCOUNTER — Other Ambulatory Visit: Payer: Self-pay | Admitting: Obstetrics & Gynecology

## 2013-06-06 ENCOUNTER — Encounter: Payer: Self-pay | Admitting: Obstetrics & Gynecology

## 2013-06-30 ENCOUNTER — Other Ambulatory Visit: Payer: Self-pay | Admitting: Family Medicine

## 2013-06-30 ENCOUNTER — Ambulatory Visit (INDEPENDENT_AMBULATORY_CARE_PROVIDER_SITE_OTHER): Payer: BC Managed Care – PPO | Admitting: Family Medicine

## 2013-06-30 ENCOUNTER — Encounter: Payer: Self-pay | Admitting: Family Medicine

## 2013-06-30 ENCOUNTER — Ambulatory Visit (HOSPITAL_COMMUNITY)
Admission: RE | Admit: 2013-06-30 | Discharge: 2013-06-30 | Disposition: A | Discharge status: Home / Self Care | Payer: BC Managed Care – PPO | Source: Ambulatory Visit | Attending: Family Medicine | Admitting: Family Medicine

## 2013-06-30 ENCOUNTER — Ambulatory Visit: Payer: BC Managed Care – PPO

## 2013-06-30 VITALS — BP 130/80 | HR 65 | Temp 98.2°F | Resp 16 | Ht 68.0 in | Wt 231.0 lb

## 2013-06-30 DIAGNOSIS — I10 Essential (primary) hypertension: Secondary | ICD-10-CM

## 2013-06-30 DIAGNOSIS — M1710 Unilateral primary osteoarthritis, unspecified knee: Secondary | ICD-10-CM

## 2013-06-30 DIAGNOSIS — R609 Edema, unspecified: Secondary | ICD-10-CM

## 2013-06-30 DIAGNOSIS — M7989 Other specified soft tissue disorders: Secondary | ICD-10-CM

## 2013-06-30 DIAGNOSIS — M179 Osteoarthritis of knee, unspecified: Secondary | ICD-10-CM

## 2013-06-30 DIAGNOSIS — R600 Localized edema: Secondary | ICD-10-CM

## 2013-06-30 DIAGNOSIS — R635 Abnormal weight gain: Secondary | ICD-10-CM

## 2013-06-30 DIAGNOSIS — M79609 Pain in unspecified limb: Secondary | ICD-10-CM

## 2013-06-30 LAB — POCT CBC
GRANULOCYTE PERCENT: 40.7 % (ref 37–80)
HCT, POC: 33 % — AB (ref 37.7–47.9)
HEMOGLOBIN: 10.5 g/dL — AB (ref 12.2–16.2)
Lymph, poc: 3.1 (ref 0.6–3.4)
MCH, POC: 20.7 pg — AB (ref 27–31.2)
MCHC: 31.8 g/dL (ref 31.8–35.4)
MCV: 64.9 fL — AB (ref 80–97)
MID (CBC): 0.4 (ref 0–0.9)
MPV: 0 fL (ref 0–99.8)
PLATELET COUNT, POC: 280 10*3/uL (ref 142–424)
POC Granulocyte: 2.4 (ref 2–6.9)
POC LYMPH PERCENT: 53.1 %L — AB (ref 10–50)
POC MID %: 6.2 %M (ref 0–12)
RBC: 5.08 M/uL (ref 4.04–5.48)
RDW, POC: 16.2 %
WBC: 5.8 10*3/uL (ref 4.6–10.2)

## 2013-06-30 LAB — BRAIN NATRIURETIC PEPTIDE: Brain Natriuretic Peptide: 46.7 pg/mL (ref 0.0–100.0)

## 2013-06-30 LAB — BASIC METABOLIC PANEL
BUN: 14 mg/dL (ref 6–23)
CO2: 27 meq/L (ref 19–32)
Calcium: 9.2 mg/dL (ref 8.4–10.5)
Chloride: 106 mEq/L (ref 96–112)
Creat: 0.66 mg/dL (ref 0.50–1.10)
Glucose, Bld: 87 mg/dL (ref 70–99)
POTASSIUM: 4.1 meq/L (ref 3.5–5.3)
Sodium: 140 mEq/L (ref 135–145)

## 2013-06-30 LAB — TSH: TSH: 1.372 u[IU]/mL (ref 0.350–4.500)

## 2013-06-30 NOTE — Progress Notes (Signed)
VASCULAR LAB PRELIMINARY  PRELIMINARY  PRELIMINARY  PRELIMINARY  Bilateral lower extremity venous duplex  completed.    Preliminary report:  Bilateral:  No evidence of DVT, superficial thrombosis, or Baker's Cyst.    Roberto Romanoski, RVT 06/30/2013, 10:33 AM

## 2013-06-30 NOTE — Progress Notes (Signed)
[facility] [address], [place] [address] [phone]  Date:  06/30/2013   Name:  [name]   DOB:  1956-01-17   MRN:  [id number]  PCP:  Abbe Amsterdam, MD    Chief Complaint: Leg Swelling   History of Present Illness:  [name] is a 58 y.o. very pleasant female patient who presents with the following:  She is here today with a concern about her legs. She has noted pain and swelling on her lateral and posterior knee areas over the last 4 months- it will come and go, and can be in in both legs.  Her right knee has especially bothered her since yesterday so she decided to come in for evaluation.   She does not have any known injury.   When she does have a flare- up the pain may last for a month or so.   She notes that her legs feel worse with climbing stairs or walking, but can also hurt at rest.   Yesterday her right knee popped loudly, and she "almost fell."  However it has not actually given way.  She notes pain behind the knees sometimes and is concerned that she could have a blood clot.   Otherwise she is doing well.    She is taking meloxicam but it does not seem to help her much.   She is on hormone replacement.  She is a former smoker.   No prior history of DVT or PE.    Recent TSH per her OBG  She also notes that she has some swelling in her ankles sometimes, this has been present for a few months.  She is on HCTZ but this does not seem to be helping much  Patient Active Problem List   Diagnosis Date Noted  . Anemia 05/27/2011  . HTN (hypertension) 05/27/2011  . Colon polyps 05/27/2011    Past Medical History  Diagnosis Date  . Hypertension     Past Surgical History  Procedure Laterality Date  . Abdominal hysterectomy    . Tubal ligation      History  Substance Use Topics  . Smoking status: Former Smoker    Types: Cigarettes    Quit date: 05/26/2000  . Smokeless tobacco: Never Used  . Alcohol Use: 0.6 oz/week    1  Glasses of wine per week    Family History  Problem Relation Age of Onset  . Heart disease Mother   . Cancer Father     Allergies  Allergen Reactions  . Latex   . Adhesive [Tape] Rash    With latex bandages.    Medication list has been reviewed and updated.  Current Outpatient Prescriptions on File Prior to Visit  Medication Sig Dispense Refill  . aspirin 81 MG tablet Take 81 mg by mouth daily.      Marland Kitchen ENJUVIA 0.9 MG tablet USE AS DIRECTED  30 tablet  0  . hydrochlorothiazide (HYDRODIURIL) 25 MG tablet Take 1 tablet (25 mg total) by mouth daily.  90 tablet  3  . loratadine (CLARITIN) 10 MG tablet Take 10 mg by mouth daily.      . meloxicam (MOBIC) 15 MG tablet Take 15 mg by mouth daily.       No current facility-administered medications on file prior to visit.    Review of Systems:  As per HPI- otherwise negative.   Physical Examination: Filed Vitals:   06/30/13 0809  BP: 130/80  Pulse: 65  Temp: 98.2 F (36.8 C)  Resp: 16   Filed Vitals:   06/30/13 0809  Height: 5\' 8"  (1.727 m)  Weight: 231 lb (104.781 kg)   Body mass index is 35.13 kg/(m^2). Ideal Body Weight: Weight in (lb) to have BMI = 25: 164.1  GEN: WDWN, NAD, Non-toxic, A & O x 3, obese, looks well HEENT: Atraumatic, Normocephalic. Neck supple. No masses, No LAD. Ears and Nose: No external deformity. CV: RRR, No M/G/R. No JVD. No thrill. No extra heart sounds. PULM: CTA B, no wheezes, crackles, rhonchi. No retractions. No resp. distress. No accessory muscle use. EXTR: No c/c.  Trace edema both ankles to mid tibia.  NEURO Normal gait.  PSYCH: Normally interactive. Conversant. Not depressed or anxious appearing.  Calm demeanor.  Right knee: full ROM, no laxity, mild crepitus,  No effusion/ heat/ redness or tenderness to exam Left knee:  full ROM, no laxity, mild crepitus,  No effusion/ heat/ redness or tenderness to exam   [facility] reading (PRIMARY) by  Dr. Patsy Lager. Right knee: negative except mild  degenerative change under patella Left knee: negative  RIGHT KNEE - COMPLETE 4+ VIEW  COMPARISON: Left knee performed today.  FINDINGS: Slight joint space narrowing. Mild spurring along the tibial spines and in the patellofemoral compartment. Probable small to moderate joint effusion. No fracture, subluxation or dislocation.  IMPRESSION: Findings similar to the left knee with mild degenerative changes and joint effusion.  LEFT KNEE - COMPLETE 4+ VIEW  COMPARISON: None.  FINDINGS: Mild degenerative changes with joint space narrowing and early spurring along the tibial spines and in the patellofemoral compartment. There is a moderate joint effusion. No fracture, subluxation or dislocation.  IMPRESSION: Mild degenerative changes and moderate joint effusion. No acute bony abnormality.  Results for orders placed in visit on 06/30/13  POCT CBC      Result Value Ref Range   WBC 5.8  4.6 - 10.2 K/uL   Lymph, poc 3.1  0.6 - 3.4   POC LYMPH PERCENT 53.1 (*) 10 - 50 %L   MID (cbc) 0.4  0 - 0.9   POC MID % 6.2  0 - 12 %M   POC Granulocyte 2.4  2 - 6.9   Granulocyte percent 40.7  37 - 80 %G   RBC 5.08  4.04 - 5.48 M/uL   Hemoglobin 10.5 (*) 12.2 - 16.2 g/dL   HCT, POC 16.1 (*) 09.6 - 47.9 %   MCV 64.9 (*) 80 - 97 fL   MCH, POC 20.7 (*) 27 - 31.2 pg   MCHC 31.8  31.8 - 35.4 g/dL   RDW, POC 04.5     Platelet Count, POC 280  142 - 424 K/uL   MPV 0  0 - 99.8 fL    Assessment and Plan: Lower extremity edema - Plan: DG Knee Complete 4 Views Left, DG Knee Complete 4 Views Right, Lower Extremity Venous Duplex Bilateral, CANCELED: US Venous Img Lower Bilateral  HTN (hypertension) - Plan: Basic metabolic panel  Weight increase - Plan: Brain natriuretic peptide, TSH, POCT CBC  Osteoarthritis, knee - Plan: Ambulatory referral to Orthopedic Surgery   Concern about DVT so sent for a doppler today.  Call report; negative.  Called to let her know. She has been seen at [facility] ortho in the  past for steroid injection in her shoulder as a WC pt.  Will refer her back there for further treatment of degenerative change in her knees.  Await other labs and follow up  Results for orders placed in visit on 06/30/13  BRAIN NATRIURETIC PEPTIDE      Result Value Ref Range   Brain Natriuretic Peptide    0.0 - 100.0 pg/mL  TSH      Result Value Ref Range   TSH    0.350 - 4.500 uIU/mL  BASIC METABOLIC PANEL      Result Value Ref Range   Sodium 140  135 - 145 mEq/L   Potassium 4.1  3.5 - 5.3 mEq/L   Chloride 106  96 - 112 mEq/L   CO2 27  19 - 32 mEq/L   Glucose, Bld 87  70 - 99 mg/dL   BUN 14  6 - 23 mg/dL   Creat 1.610.66  0.960.50 - 0.451.10 mg/dL   Calcium 9.2  8.4 - 40.910.5 mg/dL  POCT CBC      Result Value Ref Range   WBC 5.8  4.6 - 10.2 K/uL   Lymph, poc 3.1  0.6 - 3.4   POC LYMPH PERCENT 53.1 (*) 10 - 50 %L   MID (cbc) 0.4  0 - 0.9   POC MID % 6.2  0 - 12 %M   POC Granulocyte 2.4  2 - 6.9   Granulocyte percent 40.7  37 - 80 %G   RBC 5.08  4.04 - 5.48 M/uL   Hemoglobin 10.5 (*) 12.2 - 16.2 g/dL   HCT, POC 81.133.0 (*) 91.437.7 - 47.9 %   MCV 64.9 (*) 80 - 97 fL   MCH, POC 20.7 (*) 27 - 31.2 pg   MCHC 31.8  31.8 - 35.4 g/dL   RDW, POC 78.216.2     Platelet Count, POC 280  142 - 424 K/uL   MPV 0  0 - 99.8 fL   Anemia:  She admits she is supposed to see Dr. Elnoria HowardHung q 3 years for colonoscopy.  This is overdue- she will give him a call.   Signed Abbe AmsterdamJessica Lacey Dotson, MD

## 2013-06-30 NOTE — Patient Instructions (Addendum)
[facility] They have you scheduled at 9:15 am.  Let them know at the front desk you are there for a venous duplex. They will call vascular tech. Do not register as ED patient   I will call you with your ultrasound results as soon as they come in- also your labs when available.

## 2013-07-04 ENCOUNTER — Telehealth: Payer: Self-pay | Admitting: Family Medicine

## 2013-07-04 NOTE — Telephone Encounter (Signed)
Message copied by Pearline CablesOPLAND, JESSICA C on Wed Jul 04, 2013  5:22 PM ------      Message from: Pearline CablesCOPLAND, JESSICA C      Created: Sat Jun 30, 2013 11:26 PM       Look into her anemia ------

## 2013-07-04 NOTE — Telephone Encounter (Signed)
Noted that she had anemia at recent visit.  Hg 10.5, MCV 64.  Looking back in her paper chart she has had microcytic anemia since 2000 in our records, 4/200 Hg 10.6, MCV 60  Nl Hg elecrophoresis in 2009.   Normal colonoscopy 09/2004 and 2011 per chart.  We do not have notes from 2011- will request from Dr. Elnoria HowardHung.  Normal iron studies in 2011.  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

## 2013-07-05 ENCOUNTER — Encounter: Payer: Self-pay | Admitting: Family Medicine

## 2013-07-05 LAB — FERRITIN: Ferritin: 98 ng/mL (ref 10–291)

## 2013-07-25 ENCOUNTER — Ambulatory Visit: Payer: BC Managed Care – PPO | Admitting: Obstetrics & Gynecology

## 2013-10-19 ENCOUNTER — Encounter: Payer: Self-pay | Admitting: Obstetrics & Gynecology

## 2013-10-19 ENCOUNTER — Ambulatory Visit (INDEPENDENT_AMBULATORY_CARE_PROVIDER_SITE_OTHER): Payer: BC Managed Care – PPO | Admitting: Obstetrics & Gynecology

## 2013-10-19 VITALS — BP 154/98 | HR 69 | Temp 98.1°F | Ht 68.0 in | Wt 235.0 lb

## 2013-10-19 DIAGNOSIS — N951 Menopausal and female climacteric states: Secondary | ICD-10-CM

## 2013-10-19 MED ORDER — ESTRADIOL 2 MG PO TABS: 2.0000 mg | ORAL_TABLET | Freq: Every day | ORAL | Status: DC

## 2013-10-19 NOTE — Progress Notes (Signed)
Subjective:    [name] is a 58 y.o. female who presents to discuss hormone replacement therapy. Patient is requesting hormone replacement therapy due to hot flashes and insomnia. The patient is not currently taking hormone replacement therapy. Patient is hormone deficient due to menopause.  The patient currently has symptoms of hot flashes. Symptoms in the past had included: hot flashes.    Current hormone therapy:  none  Previous hormone therapy:  Estrogen: Estrace 2 mg daily Reason for starting HRT: hot flashes and insomnia   Menstrual History: OB History   Grav Para Term Preterm Abortions TAB SAB Ect Mult Living                   No LMP recorded. Patient has had a hysterectomy.     Past Medical History  Diagnosis Date  . Hypertension   . Anemia     long standing with normal ferritin, normal hg electrophorseis    Past Surgical History  Procedure Laterality Date  . Abdominal hysterectomy    . Tubal ligation    . Knee surgery      Current outpatient prescriptions:aspirin 81 MG tablet, Take 81 mg by mouth daily., Disp: , Rfl: ;  hydrochlorothiazide (HYDRODIURIL) 25 MG tablet, Take 1 tablet (25 mg total) by mouth daily., Disp: 90 tablet, Rfl: 3;  meloxicam (MOBIC) 15 MG tablet, Take 15 mg by mouth daily., Disp: , Rfl:  Allergies  Allergen Reactions  . Latex   . Adhesive [Tape] Rash    With latex bandages.    History  Substance Use Topics  . Smoking status: Former Smoker    Types: Cigarettes    Quit date: 05/26/2000  . Smokeless tobacco: Never Used  . Alcohol Use: No    Family History  Problem Relation Age of Onset  . Heart disease Mother   . Cancer Father     Review of Systems Constitutional: positive for night sweats Genitourinary:positive for hot flashes, negative for sex problems, urinary incontinence Integument/breast: negative for breast lump, breast tenderness, nipple discharge and skin lesion(s) Behavioral/Psych: negative for  depression Endocrine: positive for temperature intolerance    Objective:     BP 154/98  Pulse 69  Temp(Src) 98.1 F (36.7 C)  Ht 5\' 8"  (1.727 m)  Wt 106.595 kg (235 lb)  BMI 35.74 kg/m2    50% of 15 min visit spent on counseling and coordination of care.   Assessment:    Hormone replacement therapy in 58 y.o. woman.   Recent MMG w/dense breast--Gail model 5 yr: 2.2/life time 11.7; has had prior genetic counseling Plan:    Patient requests resumption ERT. Risks and benefits of ERT, including recent evidence on HRT effects on breast cancer and heart disease, were discussed. Will begin patient on Estrogen: Estrace 2 mg daily. MMG w/ 3-D imaging for screening Follow up as needed or in 6 mths

## 2013-10-19 NOTE — Patient Instructions (Signed)
Menopause Menopause is the normal time of life when menstrual periods stop completely. Menopause is complete when you have missed 12 consecutive menstrual periods. It usually occurs between the ages of 48 years and 55 years. Very rarely does a woman develop menopause before the age of 40 years. At menopause, your ovaries stop producing the female hormones estrogen and progesterone. This can cause undesirable symptoms and also affect your health. Sometimes the symptoms may occur 4-5 years before the menopause begins. There is no relationship between menopause and:  Oral contraceptives.  Number of children you had.  Race.  The age your menstrual periods started (menarche). Heavy smokers and very thin women may develop menopause earlier in life. CAUSES  The ovaries stop producing the female hormones estrogen and progesterone.  Other causes include:  Surgery to remove both ovaries.  The ovaries stop functioning for no known reason.  Tumors of the pituitary gland in the brain.  Medical disease that affects the ovaries and hormone production.  Radiation treatment to the abdomen or pelvis.  Chemotherapy that affects the ovaries. SYMPTOMS   Hot flashes.  Night sweats.  Decrease in sex drive.  Vaginal dryness and thinning of the vagina causing painful intercourse.  Dryness of the skin and developing wrinkles.  Headaches.  Tiredness.  Irritability.  Memory problems.  Weight gain.  Bladder infections.  Hair growth of the face and chest.  Infertility. More serious symptoms include:  Loss of bone (osteoporosis) causing breaks (fractures).  Depression.  Hardening and narrowing of the arteries (atherosclerosis) causing heart attacks and strokes. DIAGNOSIS   When the menstrual periods have stopped for 12 straight months.  Physical exam.  Hormone studies of the blood. TREATMENT  There are many treatment choices and nearly as many questions about them. The  decisions to treat or not to treat menopausal changes is an individual choice made with your health care provider. Your health care provider can discuss the treatments with you. Together, you can decide which treatment will work best for you. Your treatment choices may include:   Hormone therapy (estrogen and progesterone).  Non-hormonal medicines.  Treating the individual symptoms with medicine (for example antidepressants for depression).  Herbal medicines that may help specific symptoms.  Counseling by a psychiatrist or psychologist.  Group therapy.  Lifestyle changes including:  Eating healthy.  Regular exercise.  Limiting caffeine and alcohol.  Stress management and meditation.  No treatment. HOME CARE INSTRUCTIONS   Take the medicine your health care provider gives you as directed.  Get plenty of sleep and rest.  Exercise regularly.  Eat a diet that contains calcium (good for the bones) and soy products (acts like estrogen hormone).  Avoid alcoholic beverages.  Do not smoke.  If you have hot flashes, dress in layers.  Take supplements, calcium, and vitamin D to strengthen bones.  You can use over-the-counter lubricants or moisturizers for vaginal dryness.  Group therapy is sometimes very helpful.  Acupuncture may be helpful in some cases. SEEK MEDICAL CARE IF:   You are not sure you are in menopause.  You are having menopausal symptoms and need advice and treatment.  You are still having menstrual periods after age 55 years.  You have pain with intercourse.  Menopause is complete (no menstrual period for 12 months) and you develop vaginal bleeding.  You need a referral to a specialist (gynecologist, psychiatrist, or psychologist) for treatment. SEEK IMMEDIATE MEDICAL CARE IF:   You have severe depression.  You have excessive vaginal bleeding.    You fell and think you have a broken bone.  You have pain when you urinate.  You develop leg or  chest pain.  You have a fast pounding heart beat (palpitations).  You have severe headaches.  You develop vision problems.  You feel a lump in your breast.  You have abdominal pain or severe indigestion. Document Released: 07/03/2003 Document Revised: 12/13/2012 Document Reviewed: 11/09/2012 [employer] Patient Information 2015 [employer]. This information is not intended to replace advice given to you by your health care provider. Make sure you discuss any questions you have with your health care provider.  

## 2013-10-19 NOTE — Addendum Note (Signed)
Addended by: Marya LandryFOSTER, SUZANNE D on: 10/19/2013 12:10 PM   Modules accepted: Orders

## 2013-10-31 ENCOUNTER — Telehealth: Payer: Self-pay | Admitting: *Deleted

## 2013-10-31 NOTE — Telephone Encounter (Signed)
Message copied by Elson AreasPAUL, JANE F on Wed Oct 31, 2013 10:57 AM ------      Message from: Antionette CharJACKSON-MOORE, LISA      Created: Sun Oct 21, 2013  9:41 AM       Call patient--offer Bradley FerrisBrisdelle instead of ERT for hot flushes ------

## 2013-10-31 NOTE — Telephone Encounter (Signed)
Per doctor request- call to patient and offered nonhormonal treatment. Patient has started her Estrace with some relief. Patient wishes to continue and will call back if she ever wants to change her therapy.

## 2013-12-04 ENCOUNTER — Telehealth: Payer: Self-pay

## 2013-12-04 NOTE — Telephone Encounter (Signed)
PT STATES THE DR PUT HER ON HYDROCODONE AND WAS TOLD BY THE PHARMACY THEY ARE HAVING A PROBLEM WITH THE MEDICATION, THE MANUFACTURER ACCIDENTALLY PUT THE WRONG INGREDIENT IN IT SO PT NEED TO KNOW WHAT SHE CAN DO. PLEASE CALL [phone]

## 2013-12-04 NOTE — Telephone Encounter (Signed)
Spoke to pt, she is concerned with pedal edema, and her hydrocodone being recalled, i advised her to RTC for evaluation. She stated she will RTC today for dr copland

## 2014-02-08 ENCOUNTER — Other Ambulatory Visit: Payer: Self-pay

## 2014-03-22 ENCOUNTER — Other Ambulatory Visit: Payer: Self-pay | Admitting: Family Medicine

## 2014-04-22 ENCOUNTER — Encounter: Payer: Self-pay | Admitting: *Deleted

## 2014-04-22 ENCOUNTER — Ambulatory Visit: Payer: BC Managed Care – PPO | Admitting: Obstetrics & Gynecology

## 2014-04-23 ENCOUNTER — Encounter: Payer: Self-pay | Admitting: Obstetrics & Gynecology

## 2014-04-30 ENCOUNTER — Ambulatory Visit (INDEPENDENT_AMBULATORY_CARE_PROVIDER_SITE_OTHER): Payer: BC Managed Care – PPO | Admitting: Internal Medicine

## 2014-04-30 VITALS — BP 150/78 | HR 85 | Temp 98.0°F | Resp 16 | Ht 68.0 in | Wt 235.0 lb

## 2014-04-30 DIAGNOSIS — Z79899 Other long term (current) drug therapy: Secondary | ICD-10-CM

## 2014-04-30 DIAGNOSIS — I10 Essential (primary) hypertension: Secondary | ICD-10-CM

## 2014-04-30 DIAGNOSIS — D649 Anemia, unspecified: Secondary | ICD-10-CM

## 2014-04-30 LAB — POCT URINALYSIS DIPSTICK
BILIRUBIN UA: NEGATIVE
Blood, UA: NEGATIVE
GLUCOSE UA: NEGATIVE
KETONES UA: NEGATIVE
LEUKOCYTES UA: NEGATIVE
Nitrite, UA: NEGATIVE
SPEC GRAV UA: 1.025
Urobilinogen, UA: 0.2
pH, UA: 5.5

## 2014-04-30 LAB — POCT UA - MICROSCOPIC ONLY
Bacteria, U Microscopic: NEGATIVE
Casts, Ur, LPF, POC: NEGATIVE
Crystals, Ur, HPF, POC: NEGATIVE
RBC, urine, microscopic: NEGATIVE
WBC, Ur, HPF, POC: NEGATIVE
YEAST UA: NEGATIVE

## 2014-04-30 LAB — POCT CBC
GRANULOCYTE PERCENT: 53.2 % (ref 37–80)
HEMATOCRIT: 34.6 % — AB (ref 37.7–47.9)
HEMOGLOBIN: 10.8 g/dL — AB (ref 12.2–16.2)
Lymph, poc: 3.6 — AB (ref 0.6–3.4)
MCH, POC: 20.9 pg — AB (ref 27–31.2)
MCHC: 31.2 g/dL — AB (ref 31.8–35.4)
MCV: 66.9 fL — AB (ref 80–97)
MID (CBC): 0.2 (ref 0–0.9)
MPV: 10.1 fL (ref 0–99.8)
POC Granulocyte: 4.4 (ref 2–6.9)
POC LYMPH PERCENT: 43.8 %L (ref 10–50)
POC MID %: 3 %M (ref 0–12)
Platelet Count, POC: 305 10*3/uL (ref 142–424)
RBC: 5.17 M/uL (ref 4.04–5.48)
RDW, POC: 17.6 %
WBC: 8.3 10*3/uL (ref 4.6–10.2)

## 2014-04-30 LAB — LIPID PANEL
CHOL/HDL RATIO: 2.5 ratio
CHOLESTEROL: 181 mg/dL (ref 0–200)
HDL: 71 mg/dL (ref >39)
LDL Cholesterol: 90 mg/dL (ref 0–99)
Triglycerides: 98 mg/dL (ref <150)
VLDL: 20 mg/dL (ref 0–40)

## 2014-04-30 LAB — COMPREHENSIVE METABOLIC PANEL
ALK PHOS: 86 U/L (ref 39–117)
ALT: 12 U/L (ref 0–35)
AST: 12 U/L (ref 0–37)
Albumin: 4.3 g/dL (ref 3.5–5.2)
BUN: 19 mg/dL (ref 6–23)
CO2: 27 mEq/L (ref 19–32)
CREATININE: 0.78 mg/dL (ref 0.50–1.10)
Calcium: 9.4 mg/dL (ref 8.4–10.5)
Chloride: 105 mEq/L (ref 96–112)
Glucose, Bld: 97 mg/dL (ref 70–99)
Potassium: 3.8 mEq/L (ref 3.5–5.3)
Sodium: 141 mEq/L (ref 135–145)
TOTAL PROTEIN: 7.1 g/dL (ref 6.0–8.3)
Total Bilirubin: 0.4 mg/dL (ref 0.2–1.2)

## 2014-04-30 LAB — IRON AND TIBC
%SAT: 13 % — AB (ref 20–55)
Iron: 47 ug/dL (ref 42–145)
TIBC: 351 ug/dL (ref 250–470)
UIBC: 304 ug/dL (ref 125–400)

## 2014-04-30 LAB — TSH: TSH: 1.244 u[IU]/mL (ref 0.350–4.500)

## 2014-04-30 MED ORDER — HYDROCHLOROTHIAZIDE 25 MG PO TABS: 25.0000 mg | ORAL_TABLET | Freq: Every day | ORAL | Status: DC

## 2014-04-30 NOTE — Progress Notes (Signed)
   Subjective:    Patient ID: [name], female    DOB: May 28, 1955, 59 y.o.   MRN: [id number]  HPI HTN is controlled to 140-150/70-80s. Anemia cause is not clear. Dr. Patsy Lageropland is her MD. She feels great. She is off her meds.  Review of Systems     Objective:   Physical Exam  Constitutional: She is oriented to person, place, and time. She appears well-developed and well-nourished. No distress.  HENT:  Head: Normocephalic.  Mouth/Throat: Oropharynx is clear and moist.  Eyes: Conjunctivae and EOM are normal. Pupils are equal, round, and reactive to light.  Neck: Normal range of motion.  Cardiovascular: Normal rate and regular rhythm.   Murmur heard. Pulmonary/Chest: Effort normal and breath sounds normal.  Neurological: She is alert and oriented to person, place, and time. She exhibits normal muscle tone. Coordination normal.  Psychiatric: She has a normal mood and affect. Her behavior is normal. Thought content normal.   150/80 labs Results for orders placed or performed in visit on 04/30/14  POCT CBC  Result Value Ref Range   WBC 8.3 4.6 - 10.2 K/uL   Lymph, poc 3.6 (A) 0.6 - 3.4   POC LYMPH PERCENT 43.8 10 - 50 %L   MID (cbc) 0.2 0 - 0.9   POC MID % 3.0 0 - 12 %M   POC Granulocyte 4.4 2 - 6.9   Granulocyte percent 53.2 37 - 80 %G   RBC 5.17 4.04 - 5.48 M/uL   Hemoglobin 10.8 (A) 12.2 - 16.2 g/dL   HCT, POC 78.234.6 (A) 95.637.7 - 47.9 %   MCV 66.9 (A) 80 - 97 fL   MCH, POC 20.9 (A) 27 - 31.2 pg   MCHC 31.2 (A) 31.8 - 35.4 g/dL   RDW, POC 21.317.6 %   Platelet Count, POC 305 142 - 424 K/uL   MPV 10.1 0 - 99.8 fL  POCT UA - Microscopic Only  Result Value Ref Range   WBC, Ur, HPF, POC neg    RBC, urine, microscopic neg    Bacteria, U Microscopic neg    Mucus, UA trace    Epithelial cells, urine per micros 0-3    Crystals, Ur, HPF, POC neg    Casts, Ur, LPF, POC neg    Yeast, UA neg   POCT urinalysis dipstick  Result Value Ref Range   Color, UA yellow    Clarity, UA  clear    Glucose, UA neg    Bilirubin, UA neg    Ketones, UA neg    Spec Grav, UA 1.025    Blood, UA neg    pH, UA 5.5    Protein, UA trace    Urobilinogen, UA 0.2    Nitrite, UA neg    Leukocytes, UA Negative         Assessment & Plan:  HTN not to goal/Anemia needs cause F/up with Dr. Patsy Lageropland RF HCTZ/may need additional med/RTC 1 month

## 2014-04-30 NOTE — Patient Instructions (Addendum)
DASH Eating Plan DASH stands for "Dietary Approaches to Stop Hypertension." The DASH eating plan is a healthy eating plan that has been shown to reduce high blood pressure (hypertension). Additional health benefits may include reducing the risk of type 2 diabetes mellitus, heart disease, and stroke. The DASH eating plan may also help with weight loss. WHAT DO I NEED TO KNOW ABOUT THE DASH EATING PLAN? For the DASH eating plan, you will follow these general guidelines:  Choose foods with a percent daily value for sodium of less than 5% (as listed on the food label).  Use salt-free seasonings or herbs instead of table salt or sea salt.  Check with your health care provider or pharmacist before using salt substitutes.  Eat lower-sodium products, often labeled as "lower sodium" or "no salt added."  Eat fresh foods.  Eat more vegetables, fruits, and low-fat dairy products.  Choose whole grains. Look for the word "whole" as the first word in the ingredient list.  Choose fish and skinless chicken or turkey more often than red meat. Limit fish, poultry, and meat to 6 oz (170 g) each day.  Limit sweets, desserts, sugars, and sugary drinks.  Choose heart-healthy fats.  Limit cheese to 1 oz (28 g) per day.  Eat more home-cooked food and less restaurant, buffet, and fast food.  Limit fried foods.  Cook foods using methods other than frying.  Limit canned vegetables. If you do use them, rinse them well to decrease the sodium.  When eating at a restaurant, ask that your food be prepared with less salt, or no salt if possible. WHAT FOODS CAN I EAT? Seek help from a dietitian for individual calorie needs. Grains Whole grain or whole wheat bread. Brown rice. Whole grain or whole wheat pasta. Quinoa, bulgur, and whole grain cereals. Low-sodium cereals. Corn or whole wheat flour tortillas. Whole grain cornbread. Whole grain crackers. Low-sodium crackers. Vegetables Fresh or frozen vegetables  (raw, steamed, roasted, or grilled). Low-sodium or reduced-sodium tomato and vegetable juices. Low-sodium or reduced-sodium tomato sauce and paste. Low-sodium or reduced-sodium canned vegetables.  Fruits All fresh, canned (in natural juice), or frozen fruits. Meat and Other Protein Products Ground beef (85% or leaner), grass-fed beef, or beef trimmed of fat. Skinless chicken or turkey. Ground chicken or turkey. Pork trimmed of fat. All fish and seafood. Eggs. Dried beans, peas, or lentils. Unsalted nuts and seeds. Unsalted canned beans. Dairy Low-fat dairy products, such as skim or 1% milk, 2% or reduced-fat cheeses, low-fat ricotta or cottage cheese, or plain low-fat yogurt. Low-sodium or reduced-sodium cheeses. Fats and Oils Tub margarines without trans fats. Light or reduced-fat mayonnaise and salad dressings (reduced sodium). Avocado. Safflower, olive, or canola oils. Natural peanut or almond butter. Other Unsalted popcorn and pretzels. The items listed above may not be a complete list of recommended foods or beverages. Contact your dietitian for more options. WHAT FOODS ARE NOT RECOMMENDED? Grains White bread. White pasta. White rice. Refined cornbread. Bagels and croissants. Crackers that contain trans fat. Vegetables Creamed or fried vegetables. Vegetables in a cheese sauce. Regular canned vegetables. Regular canned tomato sauce and paste. Regular tomato and vegetable juices. Fruits Dried fruits. Canned fruit in light or heavy syrup. Fruit juice. Meat and Other Protein Products Fatty cuts of meat. Ribs, chicken wings, bacon, sausage, bologna, salami, chitterlings, fatback, hot dogs, bratwurst, and packaged luncheon meats. Salted nuts and seeds. Canned beans with salt. Dairy Whole or 2% milk, cream, half-and-half, and cream cheese. Whole-fat or sweetened yogurt. Full-fat   cheeses or blue cheese. Nondairy creamers and whipped toppings. Processed cheese, cheese spreads, or cheese  curds. Condiments Onion and garlic salt, seasoned salt, table salt, and sea salt. Canned and packaged gravies. Worcestershire sauce. Tartar sauce. Barbecue sauce. Teriyaki sauce. Soy sauce, including reduced sodium. Steak sauce. Fish sauce. Oyster sauce. Cocktail sauce. Horseradish. Ketchup and mustard. Meat flavorings and tenderizers. Bouillon cubes. Hot sauce. Tabasco sauce. Marinades. Taco seasonings. Relishes. Fats and Oils Butter, stick margarine, lard, shortening, ghee, and bacon fat. Coconut, palm kernel, or palm oils. Regular salad dressings. Other Pickles and olives. Salted popcorn and pretzels. The items listed above may not be a complete list of foods and beverages to avoid. Contact your dietitian for more information. WHERE CAN I FIND MORE INFORMATION? National Heart, Lung, and Blood Institute: travelstabloid.com Document Released: 04/01/2011 Document Revised: 08/27/2013 Document Reviewed: 02/14/2013 [facility] Patient Information 2015 [place], [place]. This information is not intended to replace advice given to you by your health care provider. Make sure you discuss any questions you have with your health care provider. Hypertension Hypertension, commonly called high blood pressure, is when the force of blood pumping through your arteries is too strong. Your arteries are the blood vessels that carry blood from your heart throughout your body. A blood pressure reading consists of a higher number over a lower number, such as 110/72. The higher number (systolic) is the pressure inside your arteries when your heart pumps. The lower number (diastolic) is the pressure inside your arteries when your heart relaxes. Ideally you want your blood pressure below 120/80. Hypertension forces your heart to work harder to pump blood. Your arteries may become narrow or stiff. Having hypertension puts you at risk for heart disease, stroke, and other problems.  RISK  FACTORS Some risk factors for high blood pressure are controllable. Others are not.  Risk factors you cannot control include:   Race. You may be at higher risk if you are African American.  Age. Risk increases with age.  Gender. Men are at higher risk than women before age 37 years. After age 55, women are at higher risk than men. Risk factors you can control include:  Not getting enough exercise or physical activity.  Being overweight.  Getting too much fat, sugar, calories, or salt in your diet.  Drinking too much alcohol. SIGNS AND SYMPTOMS Hypertension does not usually cause signs or symptoms. Extremely high blood pressure (hypertensive crisis) may cause headache, anxiety, shortness of breath, and nosebleed. DIAGNOSIS  To check if you have hypertension, your health care provider will measure your blood pressure while you are seated, with your arm held at the level of your heart. It should be measured at least twice using the same arm. Certain conditions can cause a difference in blood pressure between your right and left arms. A blood pressure reading that is higher than normal on one occasion does not mean that you need treatment. If one blood pressure reading is high, ask your health care provider about having it checked again. TREATMENT  Treating high blood pressure includes making lifestyle changes and possibly taking medicine. Living a healthy lifestyle can help lower high blood pressure. You may need to change some of your habits. Lifestyle changes may include:  Following the DASH diet. This diet is high in fruits, vegetables, and whole grains. It is low in salt, red meat, and added sugars.  Getting at least 2 hours of brisk physical activity every week.  Losing weight if necessary.  Not smoking.  Limiting  alcoholic beverages.  Learning ways to reduce stress. If lifestyle changes are not enough to get your blood pressure under control, your health care provider may  prescribe medicine. You may need to take more than one. Work closely with your health care provider to understand the risks and benefits. HOME CARE INSTRUCTIONS  Have your blood pressure rechecked as directed by your health care provider.   Take medicines only as directed by your health care provider. Follow the directions carefully. Blood pressure medicines must be taken as prescribed. The medicine does not work as well when you skip doses. Skipping doses also puts you at risk for problems.   Do not smoke.   Monitor your blood pressure at home as directed by your health care provider. SEEK MEDICAL CARE IF:   You think you are having a reaction to medicines taken.  You have recurrent headaches or feel dizzy.  You have swelling in your ankles.  You have trouble with your vision. SEEK IMMEDIATE MEDICAL CARE IF:  You develop a severe headache or confusion.  You have unusual weakness, numbness, or feel faint.  You have severe chest or abdominal pain.  You vomit repeatedly.  You have trouble breathing. MAKE SURE YOU:   Understand these instructions.  Will watch your condition.  Will get help right away if you are not doing well or get worse. Document Released: 04/12/2005 Document Revised: 08/27/2013 Document Reviewed: 02/02/2013 [facility] Patient Information 2015 [place], [place]. This information is not intended to replace advice given to you by your health care provider. Make sure you discuss any questions you have with your health care provider. Anemia, Nonspecific Anemia is a condition in which the concentration of red blood cells or hemoglobin in the blood is below normal. Hemoglobin is a substance in red blood cells that carries oxygen to the tissues of the body. Anemia results in not enough oxygen reaching these tissues.  CAUSES  Common causes of anemia include:   Excessive bleeding. Bleeding may be internal or external. This includes excessive bleeding from periods  (in women) or from the intestine.   Poor nutrition.   Chronic kidney, thyroid, and liver disease.  Bone marrow disorders that decrease red blood cell production.  Cancer and treatments for cancer.  HIV, AIDS, and their treatments.  Spleen problems that increase red blood cell destruction.  Blood disorders.  Excess destruction of red blood cells due to infection, medicines, and autoimmune disorders. SIGNS AND SYMPTOMS   Minor weakness.   Dizziness.   Headache.  Palpitations.   Shortness of breath, especially with exercise.   Paleness.  Cold sensitivity.  Indigestion.  Nausea.  Difficulty sleeping.  Difficulty concentrating. Symptoms may occur suddenly or they may develop slowly.  DIAGNOSIS  Additional blood tests are often needed. These help your health care provider determine the best treatment. Your health care provider will check your stool for blood and look for other causes of blood loss.  TREATMENT  Treatment varies depending on the cause of the anemia. Treatment can include:   Supplements of iron, vitamin B12, or folic acid.   Hormone medicines.   A blood transfusion. This may be needed if blood loss is severe.   Hospitalization. This may be needed if there is significant continual blood loss.   Dietary changes.  Spleen removal. HOME CARE INSTRUCTIONS Keep all follow-up appointments. It often takes many weeks to correct anemia, and having your health care provider check on your condition and your response to treatment is very important.  SEEK IMMEDIATE MEDICAL CARE IF:   You develop extreme weakness, shortness of breath, or chest pain.   You become dizzy or have trouble concentrating.  You develop heavy vaginal bleeding.   You develop a rash.   You have bloody or black, tarry stools.   You faint.   You vomit up blood.   You vomit repeatedly.   You have abdominal pain.  You have a fever or persistent symptoms for  more than 2-3 days.   You have a fever and your symptoms suddenly get worse.   You are dehydrated.  MAKE SURE YOU:  Understand these instructions.  Will watch your condition.  Will get help right away if you are not doing well or get worse. Document Released: 05/20/2004 Document Revised: 12/13/2012 Document Reviewed: 10/06/2012 [facility] Patient Information 2015 [place]. This information is not intended to replace advice given to you by your health care provider. Make sure you discuss any questions you have with your health care provider.

## 2014-05-01 ENCOUNTER — Encounter: Payer: Self-pay | Admitting: Radiology

## 2014-10-24 ENCOUNTER — Other Ambulatory Visit: Payer: Self-pay | Admitting: Internal Medicine

## 2014-10-25 NOTE — Telephone Encounter (Signed)
Called pt, advised she needed to RTC for follow up. One refill given.

## 2014-12-25 ENCOUNTER — Other Ambulatory Visit: Payer: Self-pay | Admitting: Internal Medicine

## 2014-12-25 DIAGNOSIS — Z1231 Encounter for screening mammogram for malignant neoplasm of breast: Secondary | ICD-10-CM

## 2015-01-01 ENCOUNTER — Ambulatory Visit
Admission: RE | Admit: 2015-01-01 | Discharge: 2015-01-01 | Disposition: A | Discharge status: Home / Self Care | Payer: BC Managed Care – PPO | Source: Ambulatory Visit | Attending: Internal Medicine | Admitting: Internal Medicine

## 2015-01-01 DIAGNOSIS — Z1231 Encounter for screening mammogram for malignant neoplasm of breast: Secondary | ICD-10-CM

## 2015-01-15 ENCOUNTER — Other Ambulatory Visit: Payer: Self-pay | Admitting: Obstetrics and Gynecology

## 2015-05-16 ENCOUNTER — Encounter: Payer: Self-pay | Admitting: Family Medicine

## 2015-05-21 ENCOUNTER — Encounter: Payer: Self-pay | Admitting: Family Medicine

## 2016-02-26 ENCOUNTER — Other Ambulatory Visit: Payer: Self-pay | Admitting: Obstetrics and Gynecology

## 2016-02-26 ENCOUNTER — Other Ambulatory Visit: Payer: Self-pay | Admitting: Internal Medicine

## 2016-02-26 DIAGNOSIS — Z1231 Encounter for screening mammogram for malignant neoplasm of breast: Secondary | ICD-10-CM

## 2016-03-11 ENCOUNTER — Ambulatory Visit
Admission: RE | Admit: 2016-03-11 | Discharge: 2016-03-11 | Disposition: A | Discharge status: Home / Self Care | Payer: BC Managed Care – PPO | Source: Ambulatory Visit | Attending: Obstetrics and Gynecology | Admitting: Obstetrics and Gynecology

## 2016-03-11 DIAGNOSIS — Z1231 Encounter for screening mammogram for malignant neoplasm of breast: Secondary | ICD-10-CM

## 2016-08-02 ENCOUNTER — Other Ambulatory Visit: Payer: Self-pay | Admitting: Orthopedic Surgery

## 2016-08-17 ENCOUNTER — Encounter: Payer: Self-pay | Admitting: Sports Medicine

## 2016-08-17 ENCOUNTER — Ambulatory Visit (INDEPENDENT_AMBULATORY_CARE_PROVIDER_SITE_OTHER): Payer: BC Managed Care – PPO | Admitting: Sports Medicine

## 2016-08-17 DIAGNOSIS — M25471 Effusion, right ankle: Secondary | ICD-10-CM | POA: Diagnosis not present

## 2016-08-17 DIAGNOSIS — M25571 Pain in right ankle and joints of right foot: Secondary | ICD-10-CM | POA: Diagnosis not present

## 2016-08-17 DIAGNOSIS — M76821 Posterior tibial tendinitis, right leg: Secondary | ICD-10-CM

## 2016-08-17 MED ORDER — TRIAMCINOLONE ACETONIDE 10 MG/ML IJ SUSP: 10.0000 mg | Freq: Once | INTRAMUSCULAR | Status: DC

## 2016-08-17 NOTE — Progress Notes (Signed)
   Subjective:    Patient ID: [name], female    DOB: [birth year], 61 y.o.   MRN: [id number]  HPI  Chief Complaint  Patient presents with  . Ankle Pain    Right; Medial side; painful and swollen x "worse the past 3 weeks". Pt had MRI and X-ray of the right ankle done 1.5 weeks ago.        Review of Systems     Objective:   Physical Exam        Assessment & Plan:

## 2016-08-17 NOTE — Progress Notes (Signed)
Subjective: [name] is a 61 y.o. female patient who presents to office for evaluation of right foot and ankle pain. Patient complains of progressive pain especially over the last 3 in the right foot and ankle; had xray and mri done and states her doctor said since she could not wear boot at work and is still in pain to see a doctor about her ligaments. Reports history of arthritis.  Patient denies any other pedal complaints. Denies acute injury/trip/fall/sprain/any causative factors.   Patient Active Problem List   Diagnosis Date Noted  . Anemia 05/27/2011  . HTN (hypertension) 05/27/2011  . Colon polyps 05/27/2011    Current Outpatient Prescriptions on File Prior to Visit  Medication Sig Dispense Refill  . estradiol (ESTRACE) 2 MG tablet Take 1 tablet (2 mg total) by mouth daily. 60 tablet 3  . hydrochlorothiazide (HYDRODIURIL) 25 MG tablet TAKE 1 TABLET (25 MG TOTAL) BY MOUTH DAILY. PATIENT NEEDS OFFICE VISIT FOR ADDITIONAL REFILLS 90 tablet 0   No current facility-administered medications on file prior to visit.     Allergies  Allergen Reactions  . Latex   . Shellfish Allergy Swelling  . Adhesive [Tape] Rash    With latex bandages.    Objective:  General: Alert and oriented x3 in no acute distress  Dermatology: No open lesions bilateral lower extremities, no webspace macerations, no ecchymosis bilateral, all nails x 10 are well manicured.  Vascular: Dorsalis Pedis and Posterior Tibial pedal pulses palpable, Capillary Fill Time 3 seconds,(+) pedal hair growth bilateral, Temperature gradient within normal limits.  Neurology: Michaell Cowing sensation intact via light touch bilateral, Protective sensation intact with Phoebe Perch Monofilament to all pedal sites, Position sense intact, vibratory intact bilateral, Deep tendon reflexes within normal limits bilateral, No babinski sign present bilateral. (- )Tinels sign bilateral.   Musculoskeletal: Mild tenderness with palpation at PT  tendon course with focal swelling at right foot/ankle with most pain at medial malleolus,No pain with calf compression bilateral. 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 Strength within normal limits in all groups bilateral.   Gait: Antalgic gait  Xray on CD reviewed supportive of arthritis and bunion with midfoot collapse supportive of planus .   Assessment and Plan: Problem List Items Addressed This Visit    None    Visit Diagnoses    Posterior tibial tendonitis, right    -  Primary   Relevant Medications   triamcinolone acetonide (KENALOG) 10 MG/ML injection 10 mg   Right ankle pain, unspecified chronicity       Relevant Medications   triamcinolone acetonide (KENALOG) 10 MG/ML injection 10 mg   Ankle swelling, right           -Complete examination performed -Xrays reviewed -Discussed treatement options for PTTD with acute tendonitis and ankle swelling with arthritis  -Advised patient that office will attempt to get MRI report that she had performed prior -After oral consent and aseptic prep, injected a mixture containing 1 ml of 2% plain lidocaine, 1 ml 0.5% plain marcaine, 0.5 ml of kenalog 10 and 0.5 ml of dexamethasone phosphate into Right medial ankle along PT course at max area of pain without complication. Post-injection care discussed with patient.  -Recommend rest, ice, elevation, OTC anti-inflammatories -Rx Trilock brace to wear when ambulating especially at work -Work note: Resume normal duty on Thursday  -Patient to return to office in 3-4 weeks for follow  up eval or sooner if condition worsens.  Asencion Islam, DPM

## 2016-08-18 ENCOUNTER — Telehealth: Payer: Self-pay | Admitting: *Deleted

## 2016-08-18 DIAGNOSIS — M7751 Other enthesopathy of right foot: Secondary | ICD-10-CM

## 2016-08-18 NOTE — Telephone Encounter (Addendum)
-----   Message from Asencion Islam, [place] sent at 08/17/2016  5:46 PM EDT ----- Regarding: MRI Result Patient had MRI done, she brought in a CD from [facility] but it only had xrays on it. Can you call to see if they have her MRI report and if so have it faxed over for my review. If [place] does not have an MRI on this patient then please call patient to see if we can get the doctor's name that ordered the MRI for her and try to track down the report this way.  Thanks Dr. Marylene Land.08/18/2016-Left message informing pt the CD from [facility] only had x-ray views, that Dr. Marylene Land would need to know the name of the doctor and phone or location of office, who ordered the MRI, and we would be able to have them fax the results to our office. Pt states she had MRI by Dr. Madelon Lips [phone]. I spoke with Dow Adolph a member of [employer], she states pt did have MRI of the ankle there but has not signed a release of information to be sent out to other doctors. I told pt she would need to sign a release of information to Dr. Candise Bowens office and have the information faxed to Dr. Marylene Land at [phone]. Pt states she will do tomorrow.08/20/2015-Pt brought MRI disc copy to [facility] reception, but no MRI result report. 08/20/2016-I informed pt Dr.Stover would need copy of the results of the MRI, faxed to our office. Pt states understanding.08/24/2016-Pt states she has called [facility] multiple times to get the report, without a call back. I told pt I would contact the office and have the report sent to our office if she had signed records release last week. I spoke with Verlon Au - [facility] and told her pt had signed release of records and we needed a copy of the MRI results faxed to our office. Verlon Au states will fax to our office. Pt states she spoke with Dr. Marylene Land and if she need to go on leave immediately she needed a note. I reviewed Dr.  Wynema Birch telephone conversation and orders to be out of work 6 weeks if employer can not accommodate light duty. Pt states she is at home now and would like to begin leave today. Note written and placed a front reception for pt pick up.09/15/2016-Faxed required form, and demographics to [facility].

## 2016-08-24 ENCOUNTER — Encounter: Payer: Self-pay | Admitting: *Deleted

## 2016-08-24 ENCOUNTER — Telehealth: Payer: Self-pay | Admitting: Sports Medicine

## 2016-08-24 DIAGNOSIS — R52 Pain, unspecified: Secondary | ICD-10-CM

## 2016-08-24 DIAGNOSIS — M779 Enthesopathy, unspecified: Secondary | ICD-10-CM

## 2016-08-24 MED ORDER — DICLOFENAC SODIUM 75 MG PO TBEC: 75.0000 mg | DELAYED_RELEASE_TABLET | Freq: Two times a day (BID) | ORAL | 0 refills | Status: DC

## 2016-08-24 NOTE — Telephone Encounter (Signed)
Reviewed MRI which reveals PT tendonitis and chronic ATF tear. Patient advised to continue with trilock, rest, ice, and elevation  Advised patient on light duty; if jobs does not allow light duty then will write patient out of work for 6 weeks -Dr. Kathie Rhodes

## 2016-08-31 ENCOUNTER — Encounter: Payer: Self-pay | Admitting: Sports Medicine

## 2016-09-03 ENCOUNTER — Telehealth: Payer: Self-pay | Admitting: Sports Medicine

## 2016-09-03 NOTE — Telephone Encounter (Signed)
Patient called about her medical leave papers. Wants to know if they have been mailed out to employer, am I supposed to come by and pick them up, will I get a copy of them. Please call me back. Thank you.

## 2016-09-14 ENCOUNTER — Ambulatory Visit (INDEPENDENT_AMBULATORY_CARE_PROVIDER_SITE_OTHER): Payer: BC Managed Care – PPO | Admitting: Sports Medicine

## 2016-09-14 ENCOUNTER — Encounter: Payer: Self-pay | Admitting: Sports Medicine

## 2016-09-14 DIAGNOSIS — M779 Enthesopathy, unspecified: Secondary | ICD-10-CM

## 2016-09-14 DIAGNOSIS — M25571 Pain in right ankle and joints of right foot: Secondary | ICD-10-CM

## 2016-09-14 DIAGNOSIS — M25471 Effusion, right ankle: Secondary | ICD-10-CM

## 2016-09-14 MED ORDER — DICLOFENAC SODIUM 75 MG PO TBEC: 75.0000 mg | DELAYED_RELEASE_TABLET | Freq: Two times a day (BID) | ORAL | 0 refills | Status: DC

## 2016-09-14 NOTE — Progress Notes (Signed)
Subjective: [name] is a 61 y.o. female patient who returns to office for evaluation of right foot and ankle pain. Patient states that rest and it is doing ok as long as she has the ankle brace on. Patient reports scheduled to return to work on 09-20-16 however is still concerned because she still has pain. Reports pain is better with Diclofenac.    Patient Active Problem List   Diagnosis Date Noted  . Anemia 05/27/2011  . HTN (hypertension) 05/27/2011  . Colon polyps 05/27/2011    Current Outpatient Prescriptions on File Prior to Visit  Medication Sig Dispense Refill  . diclofenac (VOLTAREN) 75 MG EC tablet Take 1 tablet (75 mg total) by mouth 2 (two) times daily. 30 tablet 0  . estradiol (ESTRACE) 1 MG tablet     . estradiol (ESTRACE) 2 MG tablet Take 1 tablet (2 mg total) by mouth daily. 60 tablet 3  . hydrochlorothiazide (HYDRODIURIL) 25 MG tablet TAKE 1 TABLET (25 MG TOTAL) BY MOUTH DAILY. PATIENT NEEDS OFFICE VISIT FOR ADDITIONAL REFILLS 90 tablet 0   Current Facility-Administered Medications on File Prior to Visit  Medication Dose Route Frequency Provider Last Rate Last Dose  . triamcinolone acetonide (KENALOG) 10 MG/ML injection 10 mg  10 mg Other Once Asencion IslamStover, Anubis Fundora, DPM        Allergies  Allergen Reactions  . Latex   . Shellfish Allergy Swelling  . Adhesive [Tape] Rash    With latex bandages.    Objective:  General: Alert and oriented x3 in no acute distress  Dermatology: No open lesions bilateral lower extremities, no webspace macerations, no ecchymosis bilateral, all nails x 10 are well manicured.  Vascular: Dorsalis Pedis and Posterior Tibial pedal pulses palpable, Capillary Fill Time 3 seconds,(+) pedal hair growth bilateral, Temperature gradient within normal limits.  Neurology: Michaell CowingGross sensation intact via light touch bilateral, Protective sensation intact with Phoebe PerchSemmes Weinstein Monofilament to all pedal sites, Position sense intact, vibratory intact bilateral,  Deep tendon reflexes within normal limits bilateral, No babinski sign present bilateral. (- )Tinels sign bilateral.   Musculoskeletal: Decreased tenderness with palpation at PT tendon course with focal swelling at right foot/ankle with most pain at medial malleolus,No pain with calf compression bilateral. 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 Strength within normal limits in all groups bilateral.   MRI 08-05-16 PT tendonitis with microtears, ATF chronic tear   Assessment and Plan: Problem List Items Addressed This Visit    None    Visit Diagnoses    Tendonitis    -  Primary   Right ankle pain, unspecified chronicity       Ankle swelling, right           -Complete examination performed -MRI findings again reviewed -Discussed treatement options for PTTD with acute tendonitis and ankle swelling with arthritis  -Rx PT with gait, strengthening, range of motion and treatment modalities as needed -Continue with diclofenac -Continue with rest, ice, elevation for edema control -Continue with Trilock brace to wear when ambulating especially at work -Work note: Resume normal duty on 10-04-16 after patient has gotten some sessions of PT in  -Patient to return to office in 3-4 weeks for follow up eval or sooner if condition worsens.  Asencion Islamitorya Minna Dumire, DPM

## 2016-09-15 NOTE — Telephone Encounter (Signed)
-----   Message from Asencion Islamitorya Stover, [place] sent at 09/14/2016  4:43 PM EDT ----- Regarding: Physical therapy  PT tendonitis right ankle  Range of motion, strengthing, gait stability, etc  Dr Marylene LandStover

## 2017-05-12 ENCOUNTER — Other Ambulatory Visit: Payer: Self-pay | Admitting: Obstetrics and Gynecology

## 2017-05-12 DIAGNOSIS — Z139 Encounter for screening, unspecified: Secondary | ICD-10-CM

## 2017-06-01 ENCOUNTER — Ambulatory Visit
Admission: RE | Admit: 2017-06-01 | Discharge: 2017-06-01 | Disposition: A | Discharge status: Home / Self Care | Payer: BC Managed Care – PPO | Source: Ambulatory Visit | Attending: Obstetrics and Gynecology | Admitting: Obstetrics and Gynecology

## 2017-06-01 ENCOUNTER — Ambulatory Visit: Payer: BC Managed Care – PPO

## 2017-06-01 DIAGNOSIS — Z139 Encounter for screening, unspecified: Secondary | ICD-10-CM

## 2018-01-20 ENCOUNTER — Other Ambulatory Visit: Payer: Self-pay | Admitting: Internal Medicine

## 2018-01-20 ENCOUNTER — Ambulatory Visit
Admission: RE | Admit: 2018-01-20 | Discharge: 2018-01-20 | Disposition: A | Discharge status: Home / Self Care | Payer: BC Managed Care – PPO | Source: Ambulatory Visit | Attending: Internal Medicine | Admitting: Internal Medicine

## 2018-01-20 DIAGNOSIS — R590 Localized enlarged lymph nodes: Secondary | ICD-10-CM

## 2018-01-20 DIAGNOSIS — R0609 Other forms of dyspnea: Principal | ICD-10-CM

## 2018-01-26 ENCOUNTER — Other Ambulatory Visit: Payer: BC Managed Care – PPO

## 2018-04-26 HISTORY — PX: BREAST BIOPSY: SHX20

## 2018-07-26 ENCOUNTER — Other Ambulatory Visit: Payer: Self-pay | Admitting: Obstetrics and Gynecology

## 2018-07-26 DIAGNOSIS — Z1231 Encounter for screening mammogram for malignant neoplasm of breast: Secondary | ICD-10-CM

## 2018-09-21 ENCOUNTER — Ambulatory Visit: Payer: BC Managed Care – PPO

## 2018-10-14 ENCOUNTER — Ambulatory Visit
Admission: RE | Admit: 2018-10-14 | Discharge: 2018-10-14 | Disposition: A | Discharge status: Home / Self Care | Payer: BC Managed Care – PPO | Source: Ambulatory Visit | Attending: Obstetrics and Gynecology | Admitting: Obstetrics and Gynecology

## 2018-10-14 ENCOUNTER — Other Ambulatory Visit: Payer: Self-pay

## 2018-10-14 DIAGNOSIS — Z1231 Encounter for screening mammogram for malignant neoplasm of breast: Secondary | ICD-10-CM

## 2018-10-17 ENCOUNTER — Other Ambulatory Visit: Payer: Self-pay | Admitting: Obstetrics and Gynecology

## 2018-10-17 DIAGNOSIS — R921 Mammographic calcification found on diagnostic imaging of breast: Secondary | ICD-10-CM

## 2018-10-20 ENCOUNTER — Other Ambulatory Visit: Payer: Self-pay | Admitting: Obstetrics and Gynecology

## 2018-10-20 ENCOUNTER — Ambulatory Visit
Admission: RE | Admit: 2018-10-20 | Discharge: 2018-10-20 | Disposition: A | Discharge status: Home / Self Care | Payer: BC Managed Care – PPO | Source: Ambulatory Visit | Attending: Obstetrics and Gynecology | Admitting: Obstetrics and Gynecology

## 2018-10-20 DIAGNOSIS — R921 Mammographic calcification found on diagnostic imaging of breast: Secondary | ICD-10-CM

## 2018-10-25 ENCOUNTER — Ambulatory Visit
Admission: RE | Admit: 2018-10-25 | Discharge: 2018-10-25 | Disposition: A | Discharge status: Home / Self Care | Payer: BC Managed Care – PPO | Source: Ambulatory Visit | Attending: Obstetrics and Gynecology | Admitting: Obstetrics and Gynecology

## 2018-10-25 ENCOUNTER — Other Ambulatory Visit: Payer: Self-pay

## 2018-10-25 ENCOUNTER — Other Ambulatory Visit: Payer: Self-pay | Admitting: Obstetrics and Gynecology

## 2018-10-25 DIAGNOSIS — R921 Mammographic calcification found on diagnostic imaging of breast: Secondary | ICD-10-CM

## 2020-08-04 IMAGING — MG DIGITAL SCREENING BILATERAL MAMMOGRAM WITH CAD
6 series · 6 of 6 positions shown · non-contrast
Comparison: Previous exam(s).

CLINICAL DATA: Screening.

EXAM:
DIGITAL SCREENING BILATERAL MAMMOGRAM WITH CAD

[R CC]
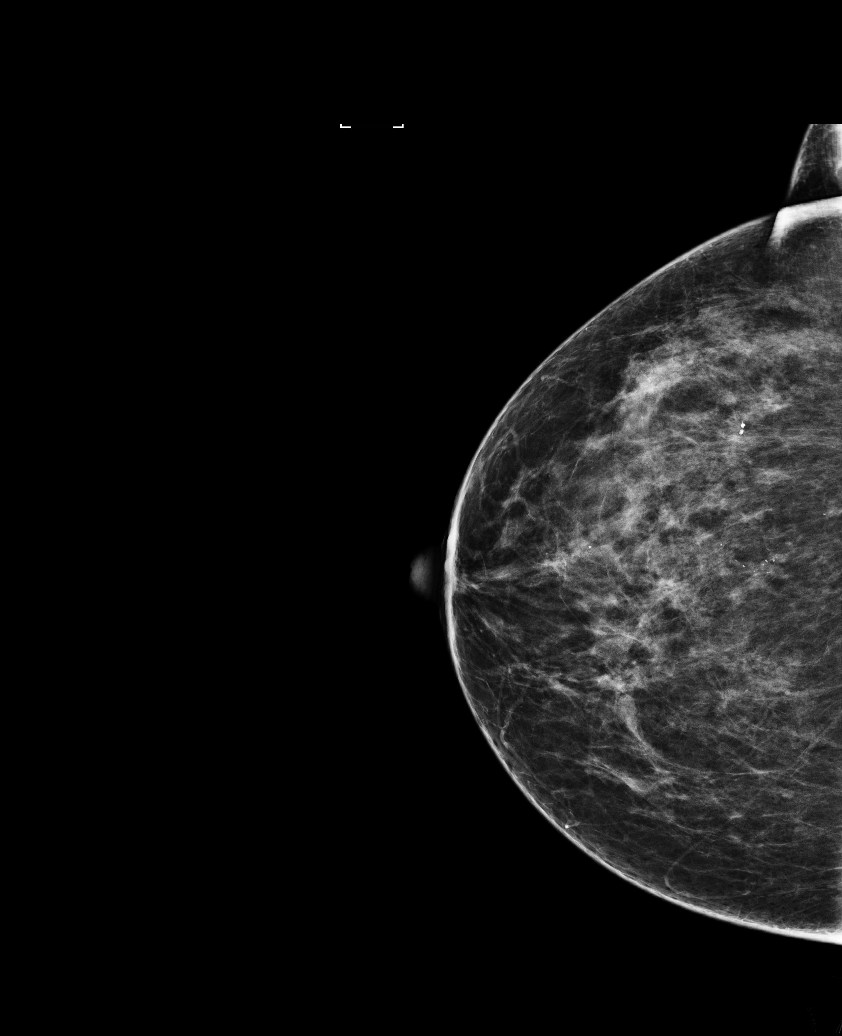

[L MLO (1 of 2)]
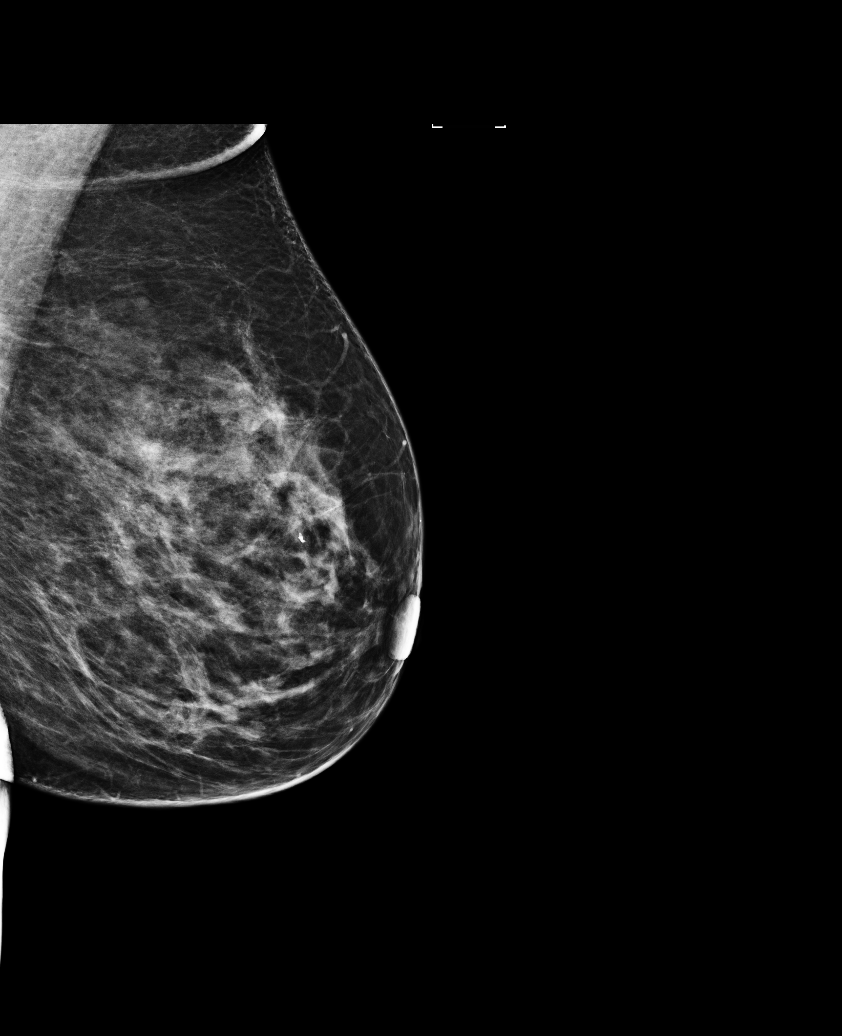

[R MLO (1 of 2)]
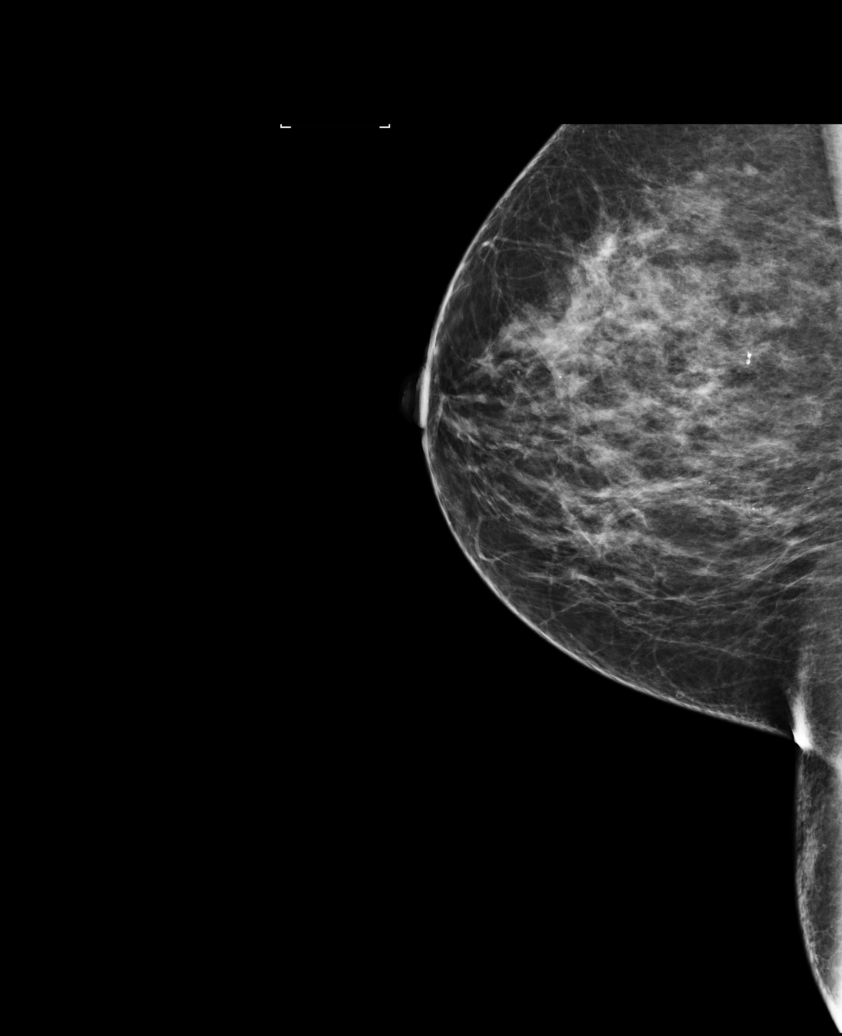

[L CC]
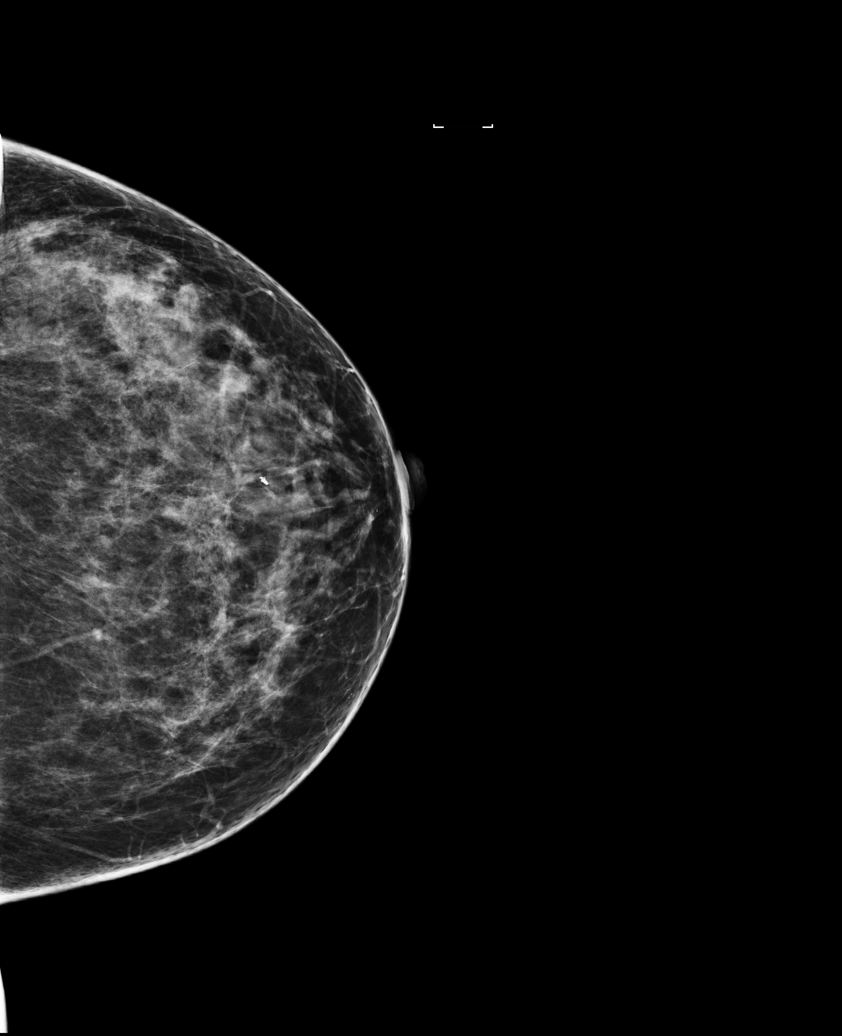

[R MLO (2 of 2)]
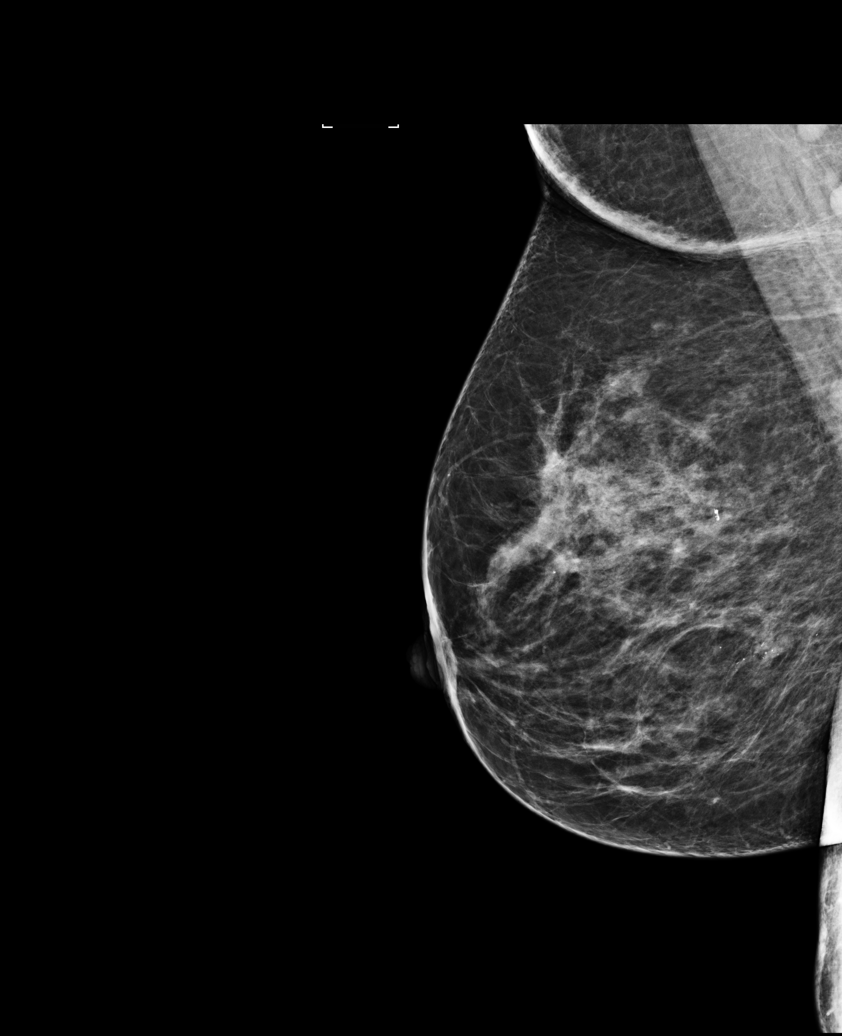

[L MLO (2 of 2)]
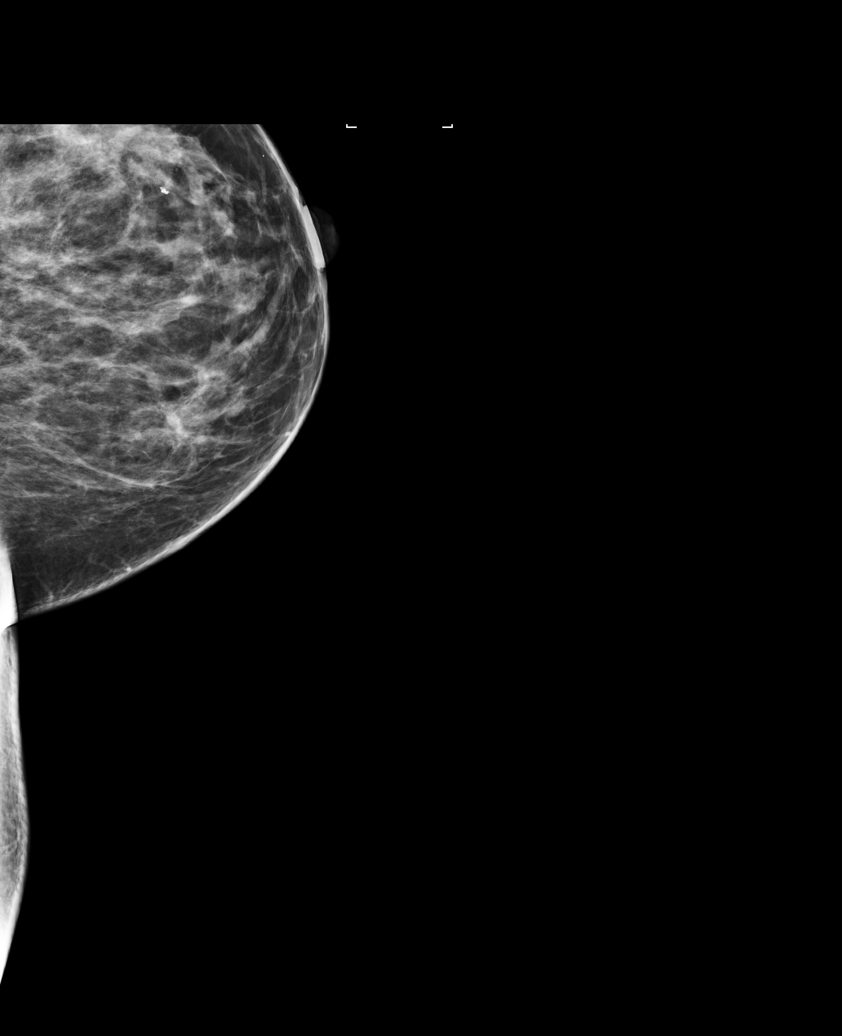

[6 of 6 positions shown; findings below may reference images not displayed]

ACR Breast Density Category c: The breast tissue is heterogeneously
dense, which may obscure small masses.
FINDINGS: In the right breast, calcifications warrant further evaluation with
magnified views. In the left breast, no findings suspicious for
malignancy. Images were processed with CAD.
IMPRESSION: Further evaluation is suggested for calcifications in the right
breast.

RECOMMENDATION:
Diagnostic mammogram of the right breast. (Code:[id number]-[id number])

The patient will be contacted regarding the findings, and additional
imaging will be scheduled.

BI-RADS CATEGORY  0: Incomplete. Need additional imaging evaluation
and/or prior mammograms for comparison.

## 2021-03-31 ENCOUNTER — Other Ambulatory Visit: Payer: Self-pay | Admitting: Obstetrics and Gynecology

## 2021-03-31 DIAGNOSIS — R928 Other abnormal and inconclusive findings on diagnostic imaging of breast: Secondary | ICD-10-CM

## 2021-04-14 ENCOUNTER — Other Ambulatory Visit: Payer: Self-pay | Admitting: Obstetrics and Gynecology

## 2021-04-14 ENCOUNTER — Ambulatory Visit
Admission: RE | Admit: 2021-04-14 | Discharge: 2021-04-14 | Disposition: A | Discharge status: Home / Self Care | Payer: BC Managed Care – PPO | Source: Ambulatory Visit | Attending: Obstetrics and Gynecology | Admitting: Obstetrics and Gynecology

## 2021-04-14 DIAGNOSIS — R928 Other abnormal and inconclusive findings on diagnostic imaging of breast: Secondary | ICD-10-CM

## 2021-04-14 DIAGNOSIS — N6001 Solitary cyst of right breast: Secondary | ICD-10-CM

## 2021-10-15 ENCOUNTER — Other Ambulatory Visit: Payer: BC Managed Care – PPO

## 2021-10-23 ENCOUNTER — Ambulatory Visit
Admission: RE | Admit: 2021-10-23 | Discharge: 2021-10-23 | Disposition: A | Discharge status: Home / Self Care | Payer: BC Managed Care – PPO | Source: Ambulatory Visit | Attending: Obstetrics and Gynecology | Admitting: Obstetrics and Gynecology

## 2021-10-23 ENCOUNTER — Other Ambulatory Visit: Payer: Self-pay | Admitting: Obstetrics and Gynecology

## 2021-10-23 DIAGNOSIS — R928 Other abnormal and inconclusive findings on diagnostic imaging of breast: Secondary | ICD-10-CM

## 2021-10-23 DIAGNOSIS — N6001 Solitary cyst of right breast: Secondary | ICD-10-CM

## 2022-04-06 ENCOUNTER — Ambulatory Visit
Admission: RE | Admit: 2022-04-06 | Discharge: 2022-04-06 | Disposition: A | Discharge status: Home / Self Care | Payer: Medicare PPO | Source: Ambulatory Visit | Attending: Obstetrics and Gynecology | Admitting: Obstetrics and Gynecology

## 2022-04-06 ENCOUNTER — Ambulatory Visit
Admission: RE | Admit: 2022-04-06 | Discharge: 2022-04-06 | Disposition: A | Discharge status: Home / Self Care | Payer: BC Managed Care – PPO | Source: Ambulatory Visit | Attending: Obstetrics and Gynecology | Admitting: Obstetrics and Gynecology

## 2022-04-06 DIAGNOSIS — N6001 Solitary cyst of right breast: Secondary | ICD-10-CM

## 2022-08-28 ENCOUNTER — Ambulatory Visit (HOSPITAL_COMMUNITY)
Admission: EM | Admit: 2022-08-28 | Discharge: 2022-08-28 | Disposition: A | Discharge status: Home / Self Care | Payer: Medicare PPO

## 2022-08-28 ENCOUNTER — Encounter (HOSPITAL_COMMUNITY): Payer: Self-pay

## 2022-08-28 DIAGNOSIS — L237 Allergic contact dermatitis due to plants, except food: Secondary | ICD-10-CM | POA: Diagnosis not present

## 2022-08-28 MED ORDER — TRIAMCINOLONE ACETONIDE 0.025 % EX OINT: 1.0000 | TOPICAL_OINTMENT | Freq: Two times a day (BID) | CUTANEOUS | 0 refills | Status: AC

## 2022-08-28 NOTE — ED Provider Notes (Signed)
[facility]    CSN: [id number] Arrival date & time: 08/28/22  1350      History   Chief Complaint Chief Complaint  Patient presents with   Rash    HPI [name] is a 67 y.o. female.  1 week history of itchy rash on the right lower arm.  It started as a couple bumps and then started to spread.  Has been working outside a lot doing yard work, lots of plant exposure Tried applying Benadryl, calamine lotion, alcohol without much relief.  She took an allergy medicine once or twice that seems to help with the itch  Denies any shortness of breath or trouble breathing, swelling of the lips or tongue, abdominal pain No history of this No new products or medications.   Past Medical History:  Diagnosis Date   Anemia    long standing with normal ferritin, normal hg electrophorseis   Hypertension     Patient Active Problem List   Diagnosis Date Noted   Anemia 05/27/2011   HTN (hypertension) 05/27/2011   Colon polyps 05/27/2011    Past Surgical History:  Procedure Laterality Date   ABDOMINAL HYSTERECTOMY     BREAST BIOPSY Right 2020   KNEE SURGERY     TUBAL LIGATION      OB History   No obstetric history on file.      Home Medications    Prior to Admission medications   Medication Sig Start Date End Date Taking? Authorizing Provider  aspirin EC 81 MG tablet Take 81 mg by mouth daily. Swallow whole.   Yes [provider]  estradiol (ESTRACE) 1 MG tablet  05/17/16  Yes [provider]  hydrochlorothiazide (HYDRODIURIL) 25 MG tablet TAKE 1 TABLET (25 MG TOTAL) BY MOUTH DAILY. PATIENT NEEDS OFFICE VISIT FOR ADDITIONAL REFILLS 10/25/14  Yes Guest, Ashley Jacobs, MD  triamcinolone (KENALOG) 0.025 % ointment Apply 1 Application topically 2 (two) times daily. 08/28/22  Yes Fredda Clarida, Lurena Joiner, PA-C    Family History Family History  Problem Relation Age of Onset   Heart disease Mother    Cancer Father    Breast cancer Sister        passed at 63     Social History Social History   Tobacco Use   Smoking status: Former    Types: Cigarettes    Quit date: 05/26/2000    Years since quitting: 22.2   Smokeless tobacco: Never  Substance Use Topics   Alcohol use: No    Alcohol/week: 1.0 standard drink of alcohol    Types: 1 Glasses of wine per week   Drug use: No     Allergies   Latex, Shellfish allergy, and Adhesive [tape]   Review of Systems Review of Systems  Skin:  Positive for rash.   As per HPI  Physical Exam Triage Vital Signs ED Triage Vitals  Enc Vitals Group     BP 08/28/22 1411 (!) 151/91     Pulse Rate 08/28/22 1411 77     Resp 08/28/22 1411 16     Temp 08/28/22 1411 98.4 F (36.9 C)     Temp Source 08/28/22 1411 Oral     SpO2 08/28/22 1411 98 %     Weight 08/28/22 1408 232 lb (105.2 kg)     Height 08/28/22 1408 5\' 7"  (1.702 m)     Head Circumference --      Peak Flow --      Pain Score 08/28/22 1408 0  Pain Loc --      Pain Edu? --      Excl. in GC? --    No data found.  Updated Vital Signs BP (!) 151/91 (BP Location: Left Arm)   Pulse 77   Temp 98.4 F (36.9 C) (Oral)   Resp 16   Ht 5\' 7"  (1.702 m)   Wt 232 lb (105.2 kg)   SpO2 98%   BMI 36.34 kg/m    Physical Exam Vitals and nursing note reviewed.  Constitutional:      General: She is not in acute distress.    Appearance: Normal appearance.  HENT:     Mouth/Throat:     Mouth: Mucous membranes are moist.     Pharynx: Oropharynx is clear. No posterior oropharyngeal erythema.  Eyes:     Conjunctiva/sclera: Conjunctivae normal.     Pupils: Pupils are equal, round, and reactive to light.  Cardiovascular:     Rate and Rhythm: Normal rate and regular rhythm.     Pulses: Normal pulses.     Heart sounds: Normal heart sounds.  Pulmonary:     Effort: Pulmonary effort is normal. No respiratory distress.     Breath sounds: Normal breath sounds. No wheezing.  Abdominal:     General: Bowel sounds are normal.     Tenderness: There  is no abdominal tenderness.  Musculoskeletal:        General: Normal range of motion.     Cervical back: Normal range of motion.  Skin:    Findings: Rash present.     Comments: Right lower arm with several areas of vesicles and papules arranged in linear fashion.  Just a few papules on the left lower arm. Spares palms. Consistent with a contact dermatitis  Neurological:     Mental Status: She is alert and oriented to person, place, and time.     UC Treatments / Results  Labs (all labs ordered are listed, but only abnormal results are displayed) Labs Reviewed - No data to display  EKG   Radiology No results found.  Procedures Procedures (including critical care time)  Medications Ordered in UC Medications - No data to display  Initial Impression / Assessment and Plan / UC Course  I have reviewed the triage vital signs and the nursing notes.  Pertinent labs & imaging results that were available during my care of the patient were reviewed by me and considered in my medical decision making (see chart for details).  Suspect rash caused by poison ivy or other plant oils.  Confined to small area of skin, will try topical steroid twice daily for the next week.  Also recommend continuing once daily allergy med to help with itch.  Discussed if rash is not improving with the cream or starts to spread or worsen, she is to return.  No questions at this time, patient agreeable to plan  Final Clinical Impressions(s) / UC Diagnoses   Final diagnoses:  Poison ivy dermatitis     Discharge Instructions      Apply the ointment twice daily to the right arm.  This should help reduce itch and start to improve the rash. Use for about 7 days  I also recommend taking once daily allergy medicine, every day, to help reduce itching as well.  If you feel rash is not improving or starts to get worse, please return    ED Prescriptions     Medication Sig Dispense Auth. Provider    triamcinolone (KENALOG) 0.025 %  ointment Apply 1 Application topically 2 (two) times daily. 30 g Elynn Patteson, Lurena Joiner, PA-C      PDMP not reviewed this encounter.   Marlow Baars, [place] 08/28/22 1616

## 2022-08-28 NOTE — Discharge Instructions (Addendum)
Apply the ointment twice daily to the right arm.  This should help reduce itch and start to improve the rash. Use for about 7 days  I also recommend taking once daily allergy medicine, every day, to help reduce itching as well.  If you feel rash is not improving or starts to get worse, please return

## 2022-08-28 NOTE — ED Triage Notes (Signed)
Patient here today with c/o of a itchy rash on both her arms X 1 week. Worse on the right. She tried benadryl, calamine lotion, alcohol, diaper rash ointment but nothing is working. She has been outside a lot doing yard work.

## 2022-12-20 DIAGNOSIS — E559 Vitamin D deficiency, unspecified: Secondary | ICD-10-CM | POA: Diagnosis not present

## 2023-01-14 DIAGNOSIS — Z6834 Body mass index (BMI) 34.0-34.9, adult: Secondary | ICD-10-CM | POA: Diagnosis not present

## 2023-01-14 DIAGNOSIS — I10 Essential (primary) hypertension: Secondary | ICD-10-CM | POA: Diagnosis not present

## 2023-01-14 DIAGNOSIS — U071 COVID-19: Secondary | ICD-10-CM | POA: Diagnosis not present

## 2023-04-13 DIAGNOSIS — Z1151 Encounter for screening for human papillomavirus (HPV): Secondary | ICD-10-CM | POA: Diagnosis not present

## 2023-04-13 DIAGNOSIS — Z6836 Body mass index (BMI) 36.0-36.9, adult: Secondary | ICD-10-CM | POA: Diagnosis not present

## 2023-04-13 DIAGNOSIS — Z01419 Encounter for gynecological examination (general) (routine) without abnormal findings: Secondary | ICD-10-CM | POA: Diagnosis not present

## 2023-04-13 DIAGNOSIS — Z1272 Encounter for screening for malignant neoplasm of vagina: Secondary | ICD-10-CM | POA: Diagnosis not present

## 2023-04-13 DIAGNOSIS — Z1331 Encounter for screening for depression: Secondary | ICD-10-CM | POA: Diagnosis not present

## 2023-04-14 ENCOUNTER — Other Ambulatory Visit: Payer: Self-pay | Admitting: Obstetrics and Gynecology

## 2023-04-14 ENCOUNTER — Other Ambulatory Visit: Payer: Self-pay | Admitting: Internal Medicine

## 2023-04-14 DIAGNOSIS — N63 Unspecified lump in unspecified breast: Secondary | ICD-10-CM

## 2023-05-06 ENCOUNTER — Ambulatory Visit
Admission: RE | Admit: 2023-05-06 | Discharge: 2023-05-06 | Disposition: A | Discharge status: Home / Self Care | Payer: Medicare PPO | Source: Ambulatory Visit | Attending: Internal Medicine | Admitting: Internal Medicine

## 2023-05-06 DIAGNOSIS — N63 Unspecified lump in unspecified breast: Secondary | ICD-10-CM

## 2023-05-06 DIAGNOSIS — N6311 Unspecified lump in the right breast, upper outer quadrant: Secondary | ICD-10-CM | POA: Diagnosis not present

## 2023-06-03 ENCOUNTER — Encounter: Payer: Self-pay | Admitting: Critical Care Medicine

## 2023-06-03 ENCOUNTER — Other Ambulatory Visit: Payer: Self-pay | Admitting: Critical Care Medicine

## 2023-06-03 MED ORDER — AMLODIPINE BESYLATE 5 MG PO TABS: 5.0000 mg | ORAL_TABLET | Freq: Every day | ORAL | 0 refills | Status: AC

## 2023-06-03 NOTE — Progress Notes (Signed)
 Patient seen  has high BP and only on low dose hydrochlorothiazide   and needs med adjustment   needs PCP f/u will contact  For today gave amlodipine  5 mg daily

## 2023-06-03 NOTE — Progress Notes (Signed)
 Pt has a pcp. Doctor at the event started pt on 5mg  of amlodipine  daily. Doctor Brent Cambric connected her to [employer] and wellness. No SDOH insecurities.

## 2023-06-17 DIAGNOSIS — I10 Essential (primary) hypertension: Secondary | ICD-10-CM | POA: Diagnosis not present

## 2023-06-17 DIAGNOSIS — R209 Unspecified disturbances of skin sensation: Secondary | ICD-10-CM | POA: Diagnosis not present

## 2023-06-17 DIAGNOSIS — R1013 Epigastric pain: Secondary | ICD-10-CM | POA: Diagnosis not present

## 2023-06-26 ENCOUNTER — Other Ambulatory Visit: Payer: Self-pay | Admitting: Critical Care Medicine

## 2023-07-12 NOTE — Progress Notes (Unsigned)
 Patient attended screening event on 06/03/2023. At the screening event pt B/P was 158/82. At the event pt documented her PCP as Renford Dills, MD, pt documented Medicare for insurance. Pt documented no to tobacco use, pt did not live with someone who smoked. Pt did not document SDOH needs at the event. Dr. Luisa Hart at the event started pt on 5mg  of amlodipine daily. Dr. Luisa Hart connected pt to community health and wellness and instructed pt to f/u with PCP.   Chart review confirms PCP is Renford Dills, MD. Pt insurance coverage is [facility]. Last office visit with PCP is not visible on [facility].   Post event initial f/u [facility] called pt PCP office at [facility]. Pt was last seen by PCP on 06/17/2023. Pt has established a new PCP at [facility]. Pt new PCP is Benita Gutter, MD, Pt has an appt with the new PCP in May 2025. No additional Health [employer] team support indicated at this time.

## 2023-07-15 DIAGNOSIS — I10 Essential (primary) hypertension: Secondary | ICD-10-CM | POA: Diagnosis not present

## 2023-08-26 DIAGNOSIS — I10 Essential (primary) hypertension: Secondary | ICD-10-CM | POA: Diagnosis not present

## 2023-08-26 DIAGNOSIS — N951 Menopausal and female climacteric states: Secondary | ICD-10-CM | POA: Diagnosis not present

## 2023-08-26 DIAGNOSIS — H6121 Impacted cerumen, right ear: Secondary | ICD-10-CM | POA: Diagnosis not present

## 2023-08-26 DIAGNOSIS — E559 Vitamin D deficiency, unspecified: Secondary | ICD-10-CM | POA: Diagnosis not present

## 2023-08-26 DIAGNOSIS — Z0000 Encounter for general adult medical examination without abnormal findings: Secondary | ICD-10-CM | POA: Diagnosis not present

## 2023-08-26 DIAGNOSIS — E785 Hyperlipidemia, unspecified: Secondary | ICD-10-CM | POA: Diagnosis not present

## 2023-08-26 DIAGNOSIS — Z23 Encounter for immunization: Secondary | ICD-10-CM | POA: Diagnosis not present
